# Patient Record
Sex: Male | Born: 1945 | Race: White | Hispanic: No | Marital: Married | Smoking: Never smoker
Health system: Southern US, Community
[De-identification: ages and names within clinical notes are randomized; demographics above are authoritative.]

## PROBLEM LIST (undated history)

## (undated) DIAGNOSIS — R200 Anesthesia of skin: Secondary | ICD-10-CM

## (undated) DIAGNOSIS — C801 Malignant (primary) neoplasm, unspecified: Secondary | ICD-10-CM

## (undated) DIAGNOSIS — C61 Malignant neoplasm of prostate: Secondary | ICD-10-CM

## (undated) DIAGNOSIS — R3129 Other microscopic hematuria: Secondary | ICD-10-CM

## (undated) DIAGNOSIS — J45909 Unspecified asthma, uncomplicated: Secondary | ICD-10-CM

## (undated) DIAGNOSIS — R3912 Poor urinary stream: Secondary | ICD-10-CM

## (undated) DIAGNOSIS — E7800 Pure hypercholesterolemia, unspecified: Secondary | ICD-10-CM

## (undated) DIAGNOSIS — M1990 Unspecified osteoarthritis, unspecified site: Secondary | ICD-10-CM

## (undated) DIAGNOSIS — R3916 Straining to void: Secondary | ICD-10-CM

## (undated) DIAGNOSIS — C439 Malignant melanoma of skin, unspecified: Secondary | ICD-10-CM

## (undated) HISTORY — PX: OTHER SURGICAL HISTORY: SHX169

---

## 2014-06-06 ENCOUNTER — Emergency Department (HOSPITAL_COMMUNITY)
Admission: EM | Admit: 2014-06-06 | Discharge: 2014-06-06 | Disposition: A | Discharge status: Home / Self Care | Payer: Medicare PPO | Attending: Emergency Medicine | Admitting: Emergency Medicine

## 2014-06-06 ENCOUNTER — Emergency Department (HOSPITAL_COMMUNITY): Payer: Medicare PPO

## 2014-06-06 ENCOUNTER — Encounter (HOSPITAL_COMMUNITY): Payer: Self-pay | Admitting: Emergency Medicine

## 2014-06-06 ENCOUNTER — Encounter: Payer: Self-pay | Admitting: Orthopedic Surgery

## 2014-06-06 DIAGNOSIS — S68610A Complete traumatic transphalangeal amputation of right index finger, initial encounter: Secondary | ICD-10-CM | POA: Insufficient documentation

## 2014-06-06 DIAGNOSIS — Y9389 Activity, other specified: Secondary | ICD-10-CM | POA: Diagnosis not present

## 2014-06-06 DIAGNOSIS — Z880 Allergy status to penicillin: Secondary | ICD-10-CM | POA: Diagnosis not present

## 2014-06-06 DIAGNOSIS — W230XXA Caught, crushed, jammed, or pinched between moving objects, initial encounter: Secondary | ICD-10-CM | POA: Insufficient documentation

## 2014-06-06 DIAGNOSIS — Z23 Encounter for immunization: Secondary | ICD-10-CM | POA: Diagnosis not present

## 2014-06-06 DIAGNOSIS — Y9289 Other specified places as the place of occurrence of the external cause: Secondary | ICD-10-CM | POA: Diagnosis not present

## 2014-06-06 DIAGNOSIS — S60940A Unspecified superficial injury of right index finger, initial encounter: Secondary | ICD-10-CM | POA: Diagnosis present

## 2014-06-06 MED ORDER — LIDOCAINE HCL 2 % IJ SOLN
10.0000 mL | Freq: Once | INTRAMUSCULAR | Status: DC
  Filled 2014-06-06: qty 20

## 2014-06-06 MED ORDER — BUPIVACAINE HCL 0.25 % IJ SOLN
20.0000 mL | Freq: Once | INTRAMUSCULAR | Status: DC
  Filled 2014-06-06: qty 20

## 2014-06-06 MED ORDER — BUPIVACAINE HCL (PF) 0.25 % IJ SOLN
30.0000 mL | Freq: Once | INTRAMUSCULAR | Status: DC
  Filled 2014-06-06: qty 30

## 2014-06-06 MED ORDER — LIDOCAINE HCL 2 % IJ SOLN
20.0000 mL | Freq: Once | INTRAMUSCULAR | Status: AC
  Administered 2014-06-06: 400 mg via INTRADERMAL
  Filled 2014-06-06: qty 20

## 2014-06-06 MED ORDER — ONDANSETRON HCL 4 MG/2ML IJ SOLN
4.0000 mg | Freq: Once | INTRAMUSCULAR | Status: AC
  Administered 2014-06-06: 4 mg via INTRAVENOUS
  Filled 2014-06-06: qty 2

## 2014-06-06 MED ORDER — CEFAZOLIN SODIUM 1-5 GM-% IV SOLN
1.0000 g | Freq: Once | INTRAVENOUS | Status: AC
  Administered 2014-06-06: 1 g via INTRAVENOUS
  Filled 2014-06-06: qty 50

## 2014-06-06 MED ORDER — OXYCODONE-ACETAMINOPHEN 5-325 MG PO TABS: 1.0000 | ORAL_TABLET | Freq: Three times a day (TID) | ORAL | Status: DC | PRN

## 2014-06-06 MED ORDER — TETANUS-DIPHTH-ACELL PERTUSSIS 5-2.5-18.5 LF-MCG/0.5 IM SUSP
0.5000 mL | Freq: Once | INTRAMUSCULAR | Status: AC
  Administered 2014-06-06: 0.5 mL via INTRAMUSCULAR
  Filled 2014-06-06: qty 0.5

## 2014-06-06 MED ORDER — SULFAMETHOXAZOLE-TRIMETHOPRIM 800-160 MG PO TABS: 1.0000 | ORAL_TABLET | Freq: Two times a day (BID) | ORAL | Status: AC

## 2014-06-06 MED ORDER — MORPHINE SULFATE 4 MG/ML IJ SOLN
4.0000 mg | Freq: Once | INTRAMUSCULAR | Status: AC
  Administered 2014-06-06: 4 mg via INTRAVENOUS
  Filled 2014-06-06: qty 1

## 2014-06-06 NOTE — ED Notes (Signed)
Pt appears comfortable, talking on phone.

## 2014-06-06 NOTE — ED Notes (Signed)
Pt declines pain meds at this time

## 2014-06-06 NOTE — ED Notes (Addendum)
This morning pt shut right index finger in door. Half on the nailbed amputated. Sensation intact. Denies blood thinner. Pt is a x 4.

## 2014-06-06 NOTE — Progress Notes (Unsigned)
Patient ID: Fred Thornton, male   DOB: 02/21/1946, 68 y.o.   MRN: [id number] See full consult # [id number] [place]

## 2014-06-06 NOTE — ED Notes (Signed)
Pain medication offered. Pt denied need for any at this time.

## 2014-06-06 NOTE — ED Notes (Signed)
Pt moved to stretcher to wait for hand surgeon.

## 2014-06-06 NOTE — ED Notes (Signed)
Finger tip placed in sterile gauze with sterile saline/sterile container and placed on ice.

## 2014-06-06 NOTE — ED Notes (Signed)
Declined W/C at D/C and was escorted to lobby by RN. 

## 2014-06-06 NOTE — Progress Notes (Signed)
Orthopedic Tech Progress Note Patient Details:  Fred Thornton 06-14-1946 [id number]  Ortho Devices Type of Ortho Device: Finger splint Ortho Device/Splint Interventions: Criss Alvine 06/06/2014, 6:16 PM

## 2014-06-06 NOTE — ED Provider Notes (Signed)
CSN: [id number]     Arrival date & time 06/06/14  4315 History  This chart was scribed for non-physician practitioner [name], working with Tanna Furry, MD by Donato Schultz, ED Scribe. This patient was seen in room TR07C/TR07C and the patient's care was started at 10:33 AM.    Chief Complaint  Patient presents with  . Finger Injury    The history is provided by the patient. No language interpreter was used.   HPI Comments: Fred Thornton is a 68 y.o. male who presents to the Emergency Department complaining of an injury to his right index finger that started at 9:35 AM this morning after he shut his finger in his car door.  He states that he is not in any pain but half of the tip on the right index finger is completely amputated.  He is not sure if his Tdap is UTD but states it's likely within the past 5 years.  He is right hand dominant.  No numbness.  Last meal this AM. Not on blood thinner.    History reviewed. No pertinent past medical history. History reviewed. No pertinent past surgical history. No family history on file. History  Substance Use Topics  . Smoking status: Never Smoker   . Smokeless tobacco: Not on file  . Alcohol Use: Yes    Review of Systems  Constitutional: Negative for fever.  Skin: Positive for wound.  Neurological: Negative for numbness.      Allergies  Amoxicillin  Home Medications   Prior to Admission medications   Not on File   BP 158/108  Temp(Src) 97.9 F (36.6 C) (Oral)  Resp 18  Ht 6' 1.75" (1.873 m)  Wt 214 lb (97.07 kg)  BMI 27.67 kg/m2  SpO2 99%  Physical Exam  Nursing note and vitals reviewed. Constitutional: He appears well-developed and well-nourished.  HENT:  Head: Normocephalic and atraumatic.  Eyes: EOM are normal.  Neck: Normal range of motion.  Cardiovascular: Normal rate.   Pulmonary/Chest: Effort normal.  Musculoskeletal: Normal range of motion.  R index finger: complete amputation of the distal phalanx with  complete nail avulsion.  Exposed bone, ttp, no joint involvement.  Neurological: He is alert.  Skin: Skin is warm and dry.  Psychiatric: He has a normal mood and affect. His behavior is normal.    ED Course  Procedures (including critical care time)  DIAGNOSTIC STUDIES: Oxygen Saturation is 99% on room air, normal by my interpretation.    COORDINATION OF CARE:  11:20 AM Complete amputation of R index distal phalanx with nail involvement.  No joint involvement.  Xray demonstrate open fracture.  Will consult hand specialist for further care.    12:15 PM i have consulted hand specialist, Dr. Amedeo Plenty who request starting pt on Ancef and he will see pt in ER.   4:25 PM Dr. Vanetta Shawl PA has seen pt and will attempt to sutures the wound.  Care discussed to oncoming provider, who will d/c pt pending treatment.     Labs Review Labs Reviewed - No data to display  Imaging Review Dg Finger Index Right  06/06/2014   CLINICAL DATA:  Initial evaluation for injury to distal right index finger 1 hr ago, close car door on index finger  EXAM: RIGHT INDEX FINGER 2+V  COMPARISON:  None.  FINDINGS: There is amputation of the soft tissues of the distal tip of the right index finger. There is a comminuted fracture of the distal aspect of the distal phalanx, with moderate  displacement of fracture fragments. The tuft of the distal phalanx appears absent.  IMPRESSION: Comminuted moderately displaced fracture distal phalanx   Electronically Signed   By: Skipper Cliche M.D.   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 I personally reviewed the imaging tests through PACS system  I reviewed  available ER/hospitalization records thought the EMR   I personally performed the services described in this documentation, which was scribed in my presence. The recorded information has been reviewed and is accurate.     [name], PA-C 06/07/14 [phone]

## 2014-06-06 NOTE — ED Notes (Signed)
Hand specialist at bedside

## 2014-06-06 NOTE — ED Provider Notes (Signed)
Transfer of care from [name], PA-C at change in shift.   Fred Thornton is a 68 year old male presenting to emergency department right index finger injury resulting in a partial dictation.  4:50 PM Hand specialist, PA at bedside performing repair.   6:00 PM Hand specialist, Dr. Amedeo Plenty, repaired patient's finger in the ED setting. Finger spplint placed as her Dr. Vanetta Shawl instructions. This provider was instructed by hand specialist PA to discharge patient with Bactrim for 2 weeks and oxycodone. Reported that patient is to followup in the office next week. Patient stable, afebrile. Patient not septic appearing. Patient stable for discharge as per hand specialist.  Medications  ceFAZolin (ANCEF) IVPB 1 g/50 mL premix (0 g Intravenous Stopped 06/06/14 1408)  morphine 4 MG/ML injection 4 mg (4 mg Intravenous Given 06/06/14 1307)  ondansetron (ZOFRAN) injection 4 mg (4 mg Intravenous Given 06/06/14 1307)  lidocaine (XYLOCAINE) 2 % (with pres) injection 400 mg (400 mg Intradermal Given by Other 06/06/14 1627)  Tdap (BOOSTRIX) injection 0.5 mL (0.5 mLs Intramuscular Given 06/06/14 1716)  ceFAZolin (ANCEF) IVPB 1 g/50 mL premix (1 g Intravenous New Bag/Given 06/06/14 1717)   Filed Vitals:   06/06/14 0948 06/06/14 1353  BP: 158/108 120/80  Pulse:  55  Temp: 97.9 F (36.6 C)   TempSrc: Oral   Resp: 18   Height: 6' 1.75" (1.873 m)   Weight: 214 lb (97.07 kg)   SpO2: 99% 97%   Diagnoses that have been ruled out:  None  Diagnoses that are still under consideration:  None  Final diagnoses:  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

## 2014-06-06 NOTE — Discharge Instructions (Signed)
Fingertip Injuries and Amputations °Fingertip injuries are common and often get injured because they are last to escape when pulling your hand out of harm's way. You have amputated (cut off) part of your finger. How this turns out depends largely on how much was amputated. If just the tip is amputated, often the end of the finger will grow back and the finger may return to much the same as it was before the injury.  °If more of the finger is missing, your caregiver has done the best with the tissue remaining to allow you to keep as much finger as is possible. Your caregiver after checking your injury has tried to leave you with a painless fingertip that has durable, feeling skin. If possible, your caregiver has tried to maintain the finger's length and appearance and preserve its fingernail.  °Please read the instructions outlined below and refer to this sheet in the next few weeks. These instructions provide you with general information on caring for yourself. Your caregiver may also give you specific instructions. While your treatment has been done according to the most current medical practices available, unavoidable complications occasionally occur. If you have any problems or questions after discharge, please call your caregiver. °HOME CARE INSTRUCTIONS  °· You may resume normal diet and activities as directed or allowed. °· Keep your hand elevated above the level of your heart. This helps decrease pain and swelling. °· Keep ice packs (or a bag of ice wrapped in a towel) on the injured area for 15-20 minutes, 03-04 times per day, for the first two days. °· Change dressings if necessary or as directed. °· Clean the wound daily or as directed. °· Only take over-the-counter or prescription medicines for pain, discomfort, or fever as directed by your caregiver. °· Keep appointments as directed. °SEEK IMMEDIATE MEDICAL CARE IF: °· You develop redness, swelling, numbness or increasing pain in the wound. °· There is  pus coming from the wound. °· You develop an unexplained oral temperature above 102° F (38.9° C) or as your caregiver suggests. °· There is a foul (bad) smell coming from the wound or dressing. °· There is a breaking open of the wound (edges not staying together) after sutures or staples have been removed. °MAKE SURE YOU:  °· Understand these instructions. °· Will watch your condition. °· Will get help right away if you are not doing well or get worse. °Document Released: 07/07/2005 Document Revised: 11/08/2011 Document Reviewed: 06/05/2008 °ExitCare® Patient Information ©2015 [employer]. This information is not intended to replace advice given to you by your health care provider. Make sure you discuss any questions you have with your health care provider. ° °

## 2014-06-07 NOTE — Consult Note (Signed)
NAME:  Fred Thornton NO.:  [id number]  MEDICAL RECORD NO.:  [id number]  LOCATION:  TR11C                        FACILITY:  [facility]  PHYSICIAN:  Satira Anis. Kniyah Khun, M.D.DATE OF BIRTH:  07/25/1946  DATE OF CONSULTATION:  06/06/2014 DATE OF DISCHARGE:  06/06/2014                                CONSULTATION   HISTORY OF PRESENT ILLNESS:  Fred Thornton is a 68 year old male who sustained an injury to his right index finger today.  He had a Guillotine amputation at the distal end of his index finger after a car door hit it.  He was taken to the emergency room and I was called.  I was asked to see him acutely for hand surgical emergency.  He denies prior injury.  He lives in [place].  He is allergic to AMOXICILLIN. He is here today with his wife.  Medicines are reviewed in his chart at length.  The patient has no advanced medical problems.  He denies liver, lung, heart, or kidney disease.  His regular physician is Dr. Melinda Crutch.  PHYSICAL EXAMINATION:  GENERAL:  He is pleasant male.  Alert and oriented in no acute distress. VITAL SIGNS:  Stable. HEENT:  He has normal HEENT. CHEST:  Clear. ABDOMEN:  Nontender, nondistended. HEART:  Regular rate. EXTREMITIES:  Lower extremity examination is benign with normal anatomy and no evidence of trauma.  Left upper extremity is neurovascularly intact.  Right upper extremity shows a Guillotine amputation about the distal phalanx of his finger with nail bed injury, nail plate injury, and disarray of the soft tissues.  X-RAYS:  X-rays reviewed; AP, lateral, and oblique.  Examined by myself and noted to be significant for fracture at the distal phalanx.  IMPRESSION:  Guillotine amputation, right index finger.  PLAN:  We can send him for I and D repair as necessary.  He desires to proceed.  All questions have been encouraged and answered.  He has been given appropriate antibiotics, he has been counseled.  He understands  risks of bleeding, infection, anesthesia, loss of nail, failure of surgery to accomplish its intended goals of relieving symptoms and restoring function.  With all issues in mind, he desires to proceed.  OPERATIVE PROCEDURE:  The patient was seen by myself and Mr. Jenean Lindau. He was given intermetacarpal block.  He underwent 2 separate surgical Betadine scrub and paints followed by copious irrigation with greater than 2 L of saline.  Following this, sterile field was secured.  This was an I and D excisional nature of an open fracture.  Knife, blade, curette, and scissor was used.  Following this, nail plate remnant was removed.  Following this, we trimmed up the eponychial fold and then performed open treatment of the bony fracture with shortening technique.  Following this, the nail bed was then repaired under 4.0 loupe magnification with 6-0 chromic.  Following this, I then performed a volar advancement flap.  The advancement flap underwent incision followed by advancing the flap for coverage from volar to dorsal.  This was inset with a combination of 4-0 Prolene, 6-0 chromic, and 5-0 chromic, and 4-0 chromic.  The patient tolerated this well.  I sculpted the edges nicely so  there would be no redundant edges.  It looked excellent.  Tourniquet was deflated.  Adaptic placed under the eponychial fold.  Following this, Adaptic, Vaseline gauze, and a sterile dressing was placed.  He tolerated this well.  He had good refill.  No complicating features.  We actually showed he and his wife the finger and they were quite pleased.  He is going to have questionable nail growth given the stellate laceration to the nail bed.  However, I do feel that we have given him ample opportunity to have the best finger with the most length.  Postoperatively, we will go ahead and plan for a conservative algorithm of care, dressing change in 8-12 days, therapy for splint protector.  We will keep the  splint on and protect the finger until the soft tissue parameters look good and then we will go ahead and move him aggressively.  He will take 6 months for the nail to regrow to a cosmetic look that we can deem stabilized into the future.  He was given Bactrim and OxyIR for purposes of antibiotic prophylaxis from infection and pain control.  Elevate, move the adjacent fingers and keep the area clean and dry.  It was a pleasure to see him today.  All questions have been encouraged and answered, do's and don'ts discussed.  Should any problems arise, he will notify us of course.  It was an absolute pleasure to see him today and participate in his care.  Should any problems occur, he will notify us of course.  Once again, he underwent, 1. I and D, open fracture, excisional in nature. 2. Nail plate removal. 3. Nail bed repair. 4. Open treatment of distal phalanx fracture. 5. Volar advancement flap, left index finger, Guillotine amputation.     Satira Anis. Amedeo Plenty, M.D.     [facility]  D:  06/06/2014  T:  06/07/2014  Job:  [id number]

## 2014-06-07 NOTE — ED Provider Notes (Signed)
Medical screening examination/treatment/procedure(s) were performed by non-physician practitioner and as supervising physician I was immediately available for consultation/collaboration.   EKG Interpretation None        Ephraim Hamburger, MD 06/07/14 1644

## 2014-06-14 NOTE — ED Provider Notes (Signed)
Medical screening examination/treatment/procedure(s) were performed by non-physician practitioner and as supervising physician I was immediately available for consultation/collaboration.   EKG Interpretation None        Tanna Furry, MD 06/14/14 [phone]

## 2014-11-15 DIAGNOSIS — C61 Malignant neoplasm of prostate: Secondary | ICD-10-CM

## 2014-11-15 HISTORY — PX: PROSTATE BIOPSY: SHX241

## 2014-11-15 HISTORY — DX: Malignant neoplasm of prostate: C61

## 2014-11-21 ENCOUNTER — Other Ambulatory Visit (HOSPITAL_COMMUNITY): Payer: Self-pay | Admitting: Urology

## 2014-11-21 DIAGNOSIS — C61 Malignant neoplasm of prostate: Secondary | ICD-10-CM

## 2014-12-12 ENCOUNTER — Encounter (HOSPITAL_COMMUNITY)
Admission: RE | Admit: 2014-12-12 | Discharge: 2014-12-12 | Disposition: A | Discharge status: Home / Self Care | Payer: Medicare PPO | Source: Ambulatory Visit | Attending: Urology | Admitting: Urology

## 2014-12-12 DIAGNOSIS — C61 Malignant neoplasm of prostate: Secondary | ICD-10-CM | POA: Diagnosis present

## 2014-12-12 MED ORDER — TECHNETIUM TC 99M MEDRONATE IV KIT
25.7000 | PACK | Freq: Once | INTRAVENOUS | Status: AC | PRN
  Administered 2014-12-12: 25.7 via INTRAVENOUS

## 2015-01-01 ENCOUNTER — Encounter: Payer: Self-pay | Admitting: Radiation Oncology

## 2015-01-01 NOTE — Progress Notes (Signed)
GU Location of Tumor / Histology: Adenocarcinoma of the Prostate  If Prostate Cancer, Gleason Score is ( 3+ 4 in 3 cores), (4+3 in 1 core), (3+3 in 2 cores) and PSA is (7.88)  Fred Thornton was originally referred by Suella Grove, PA  for further evaluation of an elevated PSA of 7.06 on 07/19/14. Urinary frequency, hesistancy, straining to void, weak flow, nad nocturia.   Past/Anticipated interventions by urology, if any: Sigmund Tannenbaum: Biopsy of the Prostate  Past/Anticipated interventions by medical oncology, if any: None at this time  Weight changes, if any: no  Bowel/Bladder complaints, if any: denies bowel issues.  IPSS score of 4.  Takes Flomax.  Nausea/Vomiting, if any: no  Pain issues, if any:  no  SAFETY ISSUES:   Prior radiation? No  Pacemaker/ICD? No  Possible current pregnancy? N/A  Is the patient on methotrexate? 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

## 2015-01-02 ENCOUNTER — Ambulatory Visit
Admission: RE | Admit: 2015-01-02 | Discharge: 2015-01-02 | Disposition: A | Discharge status: Home / Self Care | Payer: Medicare PPO | Source: Ambulatory Visit | Attending: Radiation Oncology | Admitting: Radiation Oncology

## 2015-01-02 ENCOUNTER — Encounter: Payer: Self-pay | Admitting: Radiation Oncology

## 2015-01-02 VITALS — BP 140/90 | HR 70 | Temp 98.2°F | Resp 20 | Ht 74.0 in | Wt 226.2 lb

## 2015-01-02 DIAGNOSIS — C61 Malignant neoplasm of prostate: Secondary | ICD-10-CM

## 2015-01-02 HISTORY — DX: Malignant neoplasm of prostate: C61

## 2015-01-02 HISTORY — DX: Malignant (primary) neoplasm, unspecified: C80.1

## 2015-01-02 HISTORY — DX: Malignant melanoma of skin, unspecified: C43.9

## 2015-01-02 HISTORY — DX: Poor urinary stream: R39.12

## 2015-01-02 HISTORY — DX: Pure hypercholesterolemia, unspecified: E78.00

## 2015-01-02 HISTORY — DX: Other microscopic hematuria: R31.29

## 2015-01-02 HISTORY — DX: Straining to void: R39.16

## 2015-01-02 NOTE — Progress Notes (Signed)
Please see the Nurse Progress Note in the MD Initial Consult Encounter for this patient. 

## 2015-01-02 NOTE — Progress Notes (Signed)
[facility] NEW PATIENT EVALUATION  Name: Fred Thornton MRN: [id number]  Date:   01/02/2015           DOB: [birth year]  Status: outpatient   CC:  Melinda Crutch, MD  Carolan Clines, MD , Dr. 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 male who is seen today through the courtesy of Dr. Gaynelle Arabian for discussion of possible radiation therapy in the management of his Stage T2b adenocarcinoma prostate.  He tells me that his PSA back in 2011 was in the 4-5 range.  His PSA rose to 7.06 by November 2015 along with an I PSS score of 18.  Dr. Era Bumpers repeated his PSA which was 7.88.  He underwent ultrasound-guided biopsies on 11/15/2014.  He was found to have Gleason 7 (4+3) involving 60% of one core from the right lateral apex, (pattern 4= 70%).  He also had Gleason 7 (3+4) involving 15% of one core from the right lateral base, 40% of one core from the right lateral mid gland and less than 5% of one core from the right mid gland.  He Gleason 6 (3+3) involving 30% of one core from the right apex and 5% of one core from the left mid gland.  His gland volume was approximately 77.3 mL.  His staging workup included a bone scan and CT scan of the abdomen/pelvis on 12/12/2014, and these were without evidence for metastatic disease.  He is currently doing well from a GU and GI standpoint, having started tamsulosin.  His I PSS score today is 4.  He is potent.  Dr. Era Bumpers mentioned having him have a second opinion with Dr. Alinda Money for discussion of robotic surgery.  PREVIOUS RADIATION THERAPY: No   PAST MEDICAL HISTORY:  has a past medical history of Prostate cancer (11/15/14); Microhematuria; Urinary straining; Weak urinary stream; Hypercholesteremia; Malignant neoplasm; and Melanoma.     PAST SURGICAL HISTORY:  Past Surgical  History  Procedure Laterality Date  . Prostate biopsy  11/15/14  . Hx of dermatological surgery    . Hx of metacarpal amputation      and Index finger     FAMILY HISTORY: family history includes Breast cancer in his mother; Heart block in his father.  His father died from complications of Alzheimer's disease at age 55.  His mother died from metastatic breast cancer at 10.  No family history of prostate cancer.   SOCIAL HISTORY:  reports that he has never smoked. He has never used smokeless tobacco. He reports that he drinks alcohol. He reports that he does not use illicit drugs.  Married, 2 children.  He works as a Regulatory affairs officer.   ALLERGIES: Amoxicillin   MEDICATIONS:  Current Outpatient Prescriptions  Medication Sig Dispense Refill  . tamsulosin (FLOMAX) 0.4 MG CAPS capsule Take 0.4 mg by mouth.    . oxyCODONE-acetaminophen (PERCOCET/ROXICET) 5-325 MG per tablet Take 1 tablet by mouth every 8 (eight) hours as needed for moderate pain or severe pain. (Patient not taking: Reported on 01/02/2015) 11 tablet 0   No current facility-administered medications for this encounter.     REVIEW OF SYSTEMS:  Pertinent items are noted in HPI.    PHYSICAL EXAM:  height is 6\' 2"  (1.88 m) and weight is 226 lb 3.2 oz (102.604 kg). His oral temperature is 98.2 F (36.8 C). His  blood pressure is 140/90 and his pulse is 70. His respiration is 20.   Rectal examination: The prostate gland is enlarged and there is induration along much of the right gland without evidence for periprostatic tumor extension.   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  I explained to Mr. Eline that his prognosis is related to his stage, Gleason score, and PSA level.  His Gleason score 7 is of intermediate favorability  and this places him in the intermediate risk group.  Management options include robotic surgery versus radiation therapy.  Ration therapy options include 5 weeks of external beam followed by seed implantation or 8 weeks of external beam/IMRT.  He is not a good candidate for active surveillance in view of his young age and intermediate risk disease with Gleason 7 (4+3) disease.  Initially, I was concerned about his obstructive symptoms, but this has improved significantly on tamsulosin.  His gland is probably too large for seed implantation, but if he were highly motivated, we could downsizing with 3 months of androgen deprivation therapy.  From a radiation therapy standpoint, I feel that he would be better suited for 8 weeks of external beam/IMRT.  We also discussed the option of short-term androgen deprivation therapy (6 months) which is currently being looked at in a national trial (RTOG).  We discussed the potential acute and late toxicities of radiation therapy and also androgen deprivation therapy.  I told him that I thought he would do well with either robotic surgery or radiation therapy.  He weights surgical consultation with Dr. Alinda Money.  He can call me if he wants to consider radiation therapy.  He would need placement of 3 gold seed markers for image guidance.   PLAN: As discussed above.   I spent 60 minutes face to face with the patient and more than 50% of that time was spent in counseling and/or coordination of care.

## 2015-01-28 ENCOUNTER — Other Ambulatory Visit: Payer: Self-pay | Admitting: Urology

## 2015-02-05 ENCOUNTER — Other Ambulatory Visit (HOSPITAL_COMMUNITY): Payer: Self-pay | Admitting: Urology

## 2015-02-05 DIAGNOSIS — C61 Malignant neoplasm of prostate: Secondary | ICD-10-CM

## 2015-03-10 ENCOUNTER — Ambulatory Visit (HOSPITAL_COMMUNITY)
Admission: RE | Admit: 2015-03-10 | Discharge: 2015-03-10 | Disposition: A | Discharge status: Home / Self Care | Payer: Medicare PPO | Source: Ambulatory Visit | Attending: Urology | Admitting: Urology

## 2015-03-10 DIAGNOSIS — C61 Malignant neoplasm of prostate: Secondary | ICD-10-CM | POA: Diagnosis present

## 2015-03-10 LAB — POCT I-STAT CREATININE: Creatinine, Ser: 1.1 mg/dL (ref 0.61–1.24)

## 2015-03-10 MED ORDER — GADOBENATE DIMEGLUMINE 529 MG/ML IV SOLN
20.0000 mL | Freq: Once | INTRAVENOUS | Status: AC | PRN
  Administered 2015-03-10: 20 mL via INTRAVENOUS

## 2015-03-18 ENCOUNTER — Encounter (HOSPITAL_COMMUNITY)
Admission: RE | Admit: 2015-03-18 | Discharge: 2015-03-18 | Disposition: A | Discharge status: Home / Self Care | Payer: Medicare PPO | Source: Ambulatory Visit | Attending: Urology | Admitting: Urology

## 2015-03-18 ENCOUNTER — Encounter (HOSPITAL_COMMUNITY): Payer: Self-pay

## 2015-03-18 HISTORY — DX: Anesthesia of skin: R20.0

## 2015-03-18 HISTORY — DX: Unspecified asthma, uncomplicated: J45.909

## 2015-03-18 LAB — BASIC METABOLIC PANEL
Anion gap: 9 (ref 5–15)
BUN: 15 mg/dL (ref 6–20)
CO2: 24 mmol/L (ref 22–32)
Calcium: 9.6 mg/dL (ref 8.9–10.3)
Chloride: 107 mmol/L (ref 101–111)
Creatinine, Ser: 1.14 mg/dL (ref 0.61–1.24)
GFR calc Af Amer: >60 mL/min (ref >60)
GFR calc non Af Amer: >60 mL/min (ref >60)
Glucose, Bld: 142 mg/dL — ABNORMAL HIGH (ref 65–99)
POTASSIUM: 4 mmol/L (ref 3.5–5.1)
Sodium: 140 mmol/L (ref 135–145)

## 2015-03-18 LAB — CBC
HEMATOCRIT: 48.1 % (ref 39.0–52.0)
Hemoglobin: 16.1 g/dL (ref 13.0–17.0)
MCH: 30.8 pg (ref 26.0–34.0)
MCHC: 33.5 g/dL (ref 30.0–36.0)
MCV: 92 fL (ref 78.0–100.0)
Platelets: 169 10*3/uL (ref 150–400)
RBC: 5.23 MIL/uL (ref 4.22–5.81)
RDW: 13 % (ref 11.5–15.5)
WBC: 5.2 10*3/uL (ref 4.0–10.5)

## 2015-03-18 LAB — ABO/RH: ABO/RH(D): O NEG

## 2015-03-18 NOTE — Progress Notes (Signed)
CT abd / pelvis epic 12/20/2014

## 2015-03-18 NOTE — Patient Instructions (Signed)
Fred Thornton  03/18/2015   Your procedure is scheduled on: Thursday March 20, 2015  Report to [facility] Main  Entrance take [place]  elevators to 3rd floor to  [place] at 5:15 AM.  Call this number if you have problems the morning of surgery [phone]   Remember: ONLY 1 PERSON MAY GO WITH YOU TO SHORT STAY TO GET  READY MORNING OF [place].  Do not eat food or drink liquids :After Midnight.     Take these medicines the morning of surgery with A SIP OF WATER: NONE                               You may not have any metal on your body including hair pins and              piercings  Do not wear jewelry, lotions, powders or colognes, deodorant                           Men may shave face and neck.   Do not bring valuables to the hospital. [place].  Contacts, dentures or bridgework may not be worn into surgery.  Leave suitcase in the car. After surgery it may be brought to your room.                  Please read over the following fact sheets you were given:INCENTIVE SPIROMETER; BLOOD TRANSFUSION INFORMATION SHEET _____________________________________________________________________             [facility] - Preparing for Surgery Before surgery, you can play an important role.  Because skin is not sterile, your skin needs to be as free of germs as possible.  You can reduce the number of germs on your skin by washing with CHG (chlorahexidine gluconate) soap before surgery.  CHG is an antiseptic cleaner which kills germs and bonds with the skin to continue killing germs even after washing. Please DO NOT use if you have an allergy to CHG or antibacterial soaps.  If your skin becomes reddened/irritated stop using the CHG and inform your nurse when you arrive at Short Stay. Do not shave (including legs and underarms) for at least 48 hours prior to the first CHG shower.  You may shave your  face/neck. Please follow these instructions carefully:  1.  Shower with CHG Soap the night before surgery and the  morning of Surgery.  2.  If you choose to wash your hair, wash your hair first as usual with your  normal  shampoo.  3.  After you shampoo, rinse your hair and body thoroughly to remove the  shampoo.                           4.  Use CHG as you would any other liquid soap.  You can apply chg directly  to the skin and wash                       Gently with a scrungie or clean washcloth.  5.  Apply the CHG Soap to your body ONLY FROM THE NECK DOWN.   Do  not use on face/ open                           Wound or open sores. Avoid contact with eyes, ears mouth and genitals (private parts).                       Wash face,  Genitals (private parts) with your normal soap.             6.  Wash thoroughly, paying special attention to the area where your surgery  will be performed.  7.  Thoroughly rinse your body with warm water from the neck down.  8.  DO NOT shower/wash with your normal soap after using and rinsing off  the CHG Soap.                9.  Pat yourself dry with a clean towel.            10.  Wear clean pajamas.            11.  Place clean sheets on your bed the night of your first shower and do not  sleep with pets. Day of Surgery : Do not apply any lotions/deodorants the morning of surgery.  Please wear clean clothes to the hospital/surgery center.  FAILURE TO FOLLOW THESE INSTRUCTIONS MAY RESULT IN THE CANCELLATION OF YOUR SURGERY PATIENT SIGNATURE_________________________________  NURSE SIGNATURE__________________________________  ________________________________________________________________________   Fred Thornton  An incentive spirometer is a tool that can help keep your lungs clear and active. This tool measures how well you are filling your lungs with each breath. Taking long deep breaths may help reverse or decrease the chance of developing breathing  (pulmonary) problems (especially infection) following:  A long period of time when you are unable to move or be active. BEFORE THE PROCEDURE   If the spirometer includes an indicator to show your best effort, your nurse or respiratory therapist will set it to a desired goal.  If possible, sit up straight or lean slightly forward. Try not to slouch.  Hold the incentive spirometer in an upright position. INSTRUCTIONS FOR USE   Sit on the edge of your bed if possible, or sit up as far as you can in bed or on a chair.  Hold the incentive spirometer in an upright position.  Breathe out normally.  Place the mouthpiece in your mouth and seal your lips tightly around it.  Breathe in slowly and as deeply as possible, raising the piston or the ball toward the top of the column.  Hold your breath for 3-5 seconds or for as long as possible. Allow the piston or ball to fall to the bottom of the column.  Remove the mouthpiece from your mouth and breathe out normally.  Rest for a few seconds and repeat Steps 1 through 7 at least 10 times every 1-2 hours when you are awake. Take your time and take a few normal breaths between deep breaths.  The spirometer may include an indicator to show your best effort. Use the indicator as a goal to work toward during each repetition.  After each set of 10 deep breaths, practice coughing to be sure your lungs are clear. If you have an incision (the cut made at the time of surgery), support your incision when coughing by placing a pillow or rolled up towels firmly against it. Once you are able to get out of bed,  walk around indoors and cough well. You may stop using the incentive spirometer when instructed by your caregiver.  RISKS AND COMPLICATIONS  Take your time so you do not get dizzy or light-headed.  If you are in pain, you may need to take or ask for pain medication before doing incentive spirometry. It is harder to take a deep breath if you are having  pain. AFTER USE  Rest and breathe slowly and easily.  It can be helpful to keep track of a log of your progress. Your caregiver can provide you with a simple table to help with this. If you are using the spirometer at home, follow these instructions: [place] IF:   You are having difficultly using the spirometer.  You have trouble using the spirometer as often as instructed.  Your pain medication is not giving enough relief while using the spirometer.  You develop fever of 100.5 F (38.1 C) or higher. SEEK IMMEDIATE MEDICAL CARE IF:   You cough up bloody sputum that had not been present before.  You develop fever of 102 F (38.9 C) or greater.  You develop worsening pain at or near the incision site. MAKE SURE YOU:   Understand these instructions.  Will watch your condition.  Will get help right away if you are not doing well or get worse. Document Released: 12/27/2006 Document Revised: 11/08/2011 Document Reviewed: 02/27/2007 ExitCare Patient Information 2014 Exit[employer], [place].   ________________________________________________________________________  WHAT IS A BLOOD TRANSFUSION? Blood Transfusion Information  A transfusion is the replacement of blood or some of its parts. Blood is made up of multiple cells which provide different functions.  Red blood cells carry oxygen and are used for blood loss replacement.  White blood cells fight against infection.  Platelets control bleeding.  Plasma helps clot blood.  Other blood products are available for specialized needs, such as hemophilia or other clotting disorders. BEFORE THE TRANSFUSION  Who gives blood for transfusions?   Healthy volunteers who are fully evaluated to make sure their blood is safe. This is blood bank blood. Transfusion therapy is the safest it has ever been in the practice of medicine. Before blood is taken from a donor, a complete history is taken to make sure that person has no history  of diseases nor engages in risky social behavior (examples are intravenous drug use or sexual activity with multiple partners). The donor's travel history is screened to minimize risk of transmitting infections, such as malaria. The donated blood is tested for signs of infectious diseases, such as HIV and hepatitis. The blood is then tested to be sure it is compatible with you in order to minimize the chance of a transfusion reaction. If you or a relative donates blood, this is often done in anticipation of surgery and is not appropriate for emergency situations. It takes many days to process the donated blood. RISKS AND COMPLICATIONS Although transfusion therapy is very safe and saves many lives, the main dangers of transfusion include:   Getting an infectious disease.  Developing a transfusion reaction. This is an allergic reaction to something in the blood you were given. Every precaution is taken to prevent this. The decision to have a blood transfusion has been considered carefully by your caregiver before blood is given. Blood is not given unless the benefits outweigh the risks. AFTER THE TRANSFUSION  Right after receiving a blood transfusion, you will usually feel much better and more energetic. This is especially true if your red blood cells  have gotten low (anemic). The transfusion raises the level of the red blood cells which carry oxygen, and this usually causes an energy increase.  The nurse administering the transfusion will monitor you carefully for complications. HOME CARE INSTRUCTIONS  No special instructions are needed after a transfusion. You may find your energy is better. Speak with your caregiver about any limitations on activity for underlying diseases you may have. SEEK MEDICAL CARE IF:   Your condition is not improving after your transfusion.  You develop redness or irritation at the intravenous (IV) site. SEEK IMMEDIATE MEDICAL CARE IF:  Any of the following symptoms  occur over the next 12 hours:  Shaking chills.  You have a temperature by mouth above 102 F (38.9 C), not controlled by medicine.  Chest, back, or muscle pain.  People around you feel you are not acting correctly or are confused.  Shortness of breath or difficulty breathing.  Dizziness and fainting.  You get a rash or develop hives.  You have a decrease in urine output.  Your urine turns a dark color or changes to pink, red, or brown. Any of the following symptoms occur over the next 10 days:  You have a temperature by mouth above 102 F (38.9 C), not controlled by medicine.  Shortness of breath.  Weakness after normal activity.  The white part of the eye turns yellow (jaundice).  You have a decrease in the amount of urine or are urinating less often.  Your urine turns a dark color or changes to pink, red, or brown. Document Released: 08/13/2000 Document Revised: 11/08/2011 Document Reviewed: 04/01/2008 [facility] Patient Information 2014 [place], [place].  _______________________________________________________________________

## 2015-03-19 NOTE — H&P (Signed)
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 Gaynelle Arabian. This confirmed Gleason 4+3=7 adenocarcinoma of the prostate with 6 out of 12 biopsy cores positive for malignancy. Staging studies were performed including a bone scan (12/12/14) and CT scan of the abdomen and pelvis (12/12/14) that were negative for metastatic disease. He has no family history of prostate cancer. He initially presented with significant LUTS with a baseline IPSS of 18 with significant improvement of his symptoms after starting alpha blocker therapy with tamsulosin. He has been counseled by Dr. Gaynelle Arabian and Dr. Valere Dross in Radiation Oncology.  TNM stage: cT2b N0 M0 (R base/mid induration) PSA: 7.88 Gleason score: 4+3=7 Biopsy (11/15/14): 6/12 cores positive   Left: L mid (5%, 3+3=6)   Right: R apex (30%, 3+3=6), R lateral apex (60%, 4+3=7), R mid (<5%, 3+4=7, PNI), R lateral mid (40%, 3+4=7, PNI), R lateral base (15%, 3+4=7, PNI) Prostate volume: 77.3 cc  Nomogram information OC disease: 27% EPE: 70% SVI: 12% LNI: 12% PFS (surgery): 60%, 44%  Urinary function: He does have significant lower urinary tract symptoms that have been significantly improved with alpha blocker therapy. He feels that tamsulosin, which he started in January, has significantly improved his quality of life with regard to urination. IPSS is 7 on therapy.  Erectile function: He does have moderate erectile dysfunction. SHIM score is 17. He has not previously been treated with PDE 5 inhibitors. He estimates that he could get an erection adequate for intercourse 7 out of 10 times.     Past Medical History Problems  1. History of hypercholesterolemia (Z86.39) 2. History of malignant neoplasm of skin (Y30.160)  Surgical History Problems  1.  History of Dermatological Surgery 2. History of Metacarpal Amputation And Index Finger  Current Meds 1. Tamsulosin HCl - 0.4 MG Oral Capsule; TAKE 1 CAPSULE Bedtime;  Therapy: [id number] to (Last DU:[id number])  Requested for: [id number] Ordered  Allergies Medication  1. Amoxicillin TABS  Family History Problems  1. Family history of Deceased : Mother, Father 2. Family history of cardiac disorder (Z82.49) : Father 3. Family history of malignant neoplasm of breast (Z80.3) : Mother  Social History Problems    Alcohol use (Z78.9)   < 1 per day   Married   Never a smoker   Number of children   2 sons   Occupation   Forensic psychologist  Review of Systems Constitutional, skin, eye, otolaryngeal, hematologic/lymphatic, cardiovascular, pulmonary, endocrine, musculoskeletal, gastrointestinal, neurological and psychiatric system(s) were reviewed and pertinent findings if present are noted and are otherwise negative.    Vitals  Height: 6 ft 1.5 in Weight: 214 lb  BMI Calculated: 27.85 BSA Calculated: 2.23  Physical Exam Constitutional: Well nourished and well developed . No acute distress.  ENT:. The ears and nose are normal in appearance.  Neck: The appearance of the neck is normal and no neck mass is present.  Pulmonary: No respiratory distress, normal respiratory rhythm and effort and clear bilateral breath sounds.  Cardiovascular: Heart rate and rhythm are normal . No peripheral edema.  Abdomen: The abdomen is soft and nontender. No masses are palpated. No CVA tenderness. No hernias are palpable. No hepatosplenomegaly noted.      Assessment Assessed  1. Prostate cancer (C61)    Discussion/Summary 1. Prostate cancer: He  will undergo a robot-assisted laparoscopic radical prostatectomy and bilateral pelvic lymphadenectomy.

## 2015-03-19 NOTE — Anesthesia Preprocedure Evaluation (Addendum)
Anesthesia Evaluation  Patient identified by MRN, date of birth, ID band Patient awake    Reviewed: Allergy & Precautions, NPO status , Patient's Chart, lab work & pertinent test results  History of Anesthesia Complications Negative for: history of anesthetic complications  Airway Mallampati: II  TM Distance: >3 FB Neck ROM: Full    Dental no notable dental hx. (+) Dental Advisory Given   Pulmonary asthma ,  breath sounds clear to auscultation  Pulmonary exam normal       Cardiovascular negative cardio ROS Normal cardiovascular examRhythm:Regular Rate:Normal     Neuro/Psych negative neurological ROS  negative psych ROS   GI/Hepatic negative GI ROS, Neg liver ROS,   Endo/Other  negative endocrine ROS  Renal/GU negative Renal ROS  negative genitourinary   Musculoskeletal negative musculoskeletal ROS (+)   Abdominal   Peds negative pediatric ROS (+)  Hematology negative hematology ROS (+)   Anesthesia Other Findings Prostate Ca  Reproductive/Obstetrics negative OB ROS                            Anesthesia Physical Anesthesia Plan  ASA: II  Anesthesia Plan: General   Post-op Pain Management:    Induction: Intravenous  Airway Management Planned: Oral ETT  Additional Equipment:   Intra-op Plan:   Post-operative Plan: Extubation in OR  Informed Consent: I have reviewed the patients History and Physical, chart, labs and discussed the procedure including the risks, benefits and alternatives for the proposed anesthesia with the patient or authorized representative who has indicated his/her understanding and acceptance.   Dental advisory given  Plan Discussed with: CRNA  Anesthesia Plan Comments:         Anesthesia Quick Evaluation

## 2015-03-20 ENCOUNTER — Encounter (HOSPITAL_COMMUNITY)
Admission: RE | Disposition: A | Discharge status: Home / Self Care | Payer: Self-pay | Source: Ambulatory Visit | Attending: Urology

## 2015-03-20 ENCOUNTER — Inpatient Hospital Stay (HOSPITAL_COMMUNITY): Payer: Medicare PPO | Admitting: Anesthesiology

## 2015-03-20 ENCOUNTER — Encounter (HOSPITAL_COMMUNITY): Payer: Self-pay | Admitting: *Deleted

## 2015-03-20 ENCOUNTER — Inpatient Hospital Stay (HOSPITAL_COMMUNITY)
Admission: RE | Admit: 2015-03-20 | Discharge: 2015-03-21 | DRG: 708 | Disposition: A | Discharge status: Home / Self Care | Payer: Medicare PPO | Source: Ambulatory Visit | Attending: Urology | Admitting: Urology

## 2015-03-20 DIAGNOSIS — N529 Male erectile dysfunction, unspecified: Secondary | ICD-10-CM | POA: Diagnosis present

## 2015-03-20 DIAGNOSIS — Z803 Family history of malignant neoplasm of breast: Secondary | ICD-10-CM | POA: Diagnosis not present

## 2015-03-20 DIAGNOSIS — C61 Malignant neoplasm of prostate: Secondary | ICD-10-CM | POA: Diagnosis present

## 2015-03-20 DIAGNOSIS — Z85828 Personal history of other malignant neoplasm of skin: Secondary | ICD-10-CM

## 2015-03-20 DIAGNOSIS — Z01812 Encounter for preprocedural laboratory examination: Secondary | ICD-10-CM

## 2015-03-20 DIAGNOSIS — Z8249 Family history of ischemic heart disease and other diseases of the circulatory system: Secondary | ICD-10-CM

## 2015-03-20 DIAGNOSIS — E780 Pure hypercholesterolemia: Secondary | ICD-10-CM | POA: Diagnosis present

## 2015-03-20 HISTORY — PX: ROBOT ASSISTED LAPAROSCOPIC RADICAL PROSTATECTOMY: SHX5141

## 2015-03-20 HISTORY — PX: LYMPHADENECTOMY: SHX5960

## 2015-03-20 LAB — TYPE AND SCREEN
ABO/RH(D): O NEG
ANTIBODY SCREEN: NEGATIVE

## 2015-03-20 LAB — HEMOGLOBIN AND HEMATOCRIT, BLOOD
HEMATOCRIT: 44.3 % (ref 39.0–52.0)
Hemoglobin: 14.8 g/dL (ref 13.0–17.0)

## 2015-03-20 SURGERY — ROBOTIC ASSISTED LAPAROSCOPIC RADICAL PROSTATECTOMY LEVEL 2
Anesthesia: General

## 2015-03-20 MED ORDER — CEFAZOLIN SODIUM-DEXTROSE 2-3 GM-% IV SOLR
2.0000 g | INTRAVENOUS | Status: AC
  Administered 2015-03-20: 2 g via INTRAVENOUS

## 2015-03-20 MED ORDER — HYDROMORPHONE HCL 2 MG/ML IJ SOLN
INTRAMUSCULAR | Status: AC
  Filled 2015-03-20: qty 1

## 2015-03-20 MED ORDER — ONDANSETRON HCL 4 MG/2ML IJ SOLN
INTRAMUSCULAR | Status: AC
  Filled 2015-03-20: qty 2

## 2015-03-20 MED ORDER — KETOROLAC TROMETHAMINE 15 MG/ML IJ SOLN
15.0000 mg | Freq: Four times a day (QID) | INTRAMUSCULAR | Status: DC
  Administered 2015-03-20 – 2015-03-21 (×4): 15 mg via INTRAVENOUS
  Filled 2015-03-20 (×5): qty 1

## 2015-03-20 MED ORDER — DIPHENHYDRAMINE HCL 12.5 MG/5ML PO ELIX
12.5000 mg | ORAL_SOLUTION | Freq: Four times a day (QID) | ORAL | Status: DC | PRN
  Filled 2015-03-20: qty 10

## 2015-03-20 MED ORDER — STERILE WATER FOR IRRIGATION IR SOLN
Status: DC | PRN
  Administered 2015-03-20: 1500 mL

## 2015-03-20 MED ORDER — HYDROMORPHONE HCL 1 MG/ML IJ SOLN
INTRAMUSCULAR | Status: DC | PRN
  Administered 2015-03-20 (×4): .4 mg via INTRAVENOUS

## 2015-03-20 MED ORDER — ACETAMINOPHEN 325 MG PO TABS
650.0000 mg | ORAL_TABLET | ORAL | Status: DC | PRN
  Filled 2015-03-20: qty 2

## 2015-03-20 MED ORDER — ONDANSETRON HCL 4 MG/2ML IJ SOLN: 4.0000 mg | Freq: Once | INTRAMUSCULAR | Status: DC | PRN

## 2015-03-20 MED ORDER — CEFAZOLIN SODIUM 1-5 GM-% IV SOLN
1.0000 g | Freq: Three times a day (TID) | INTRAVENOUS | Status: AC
  Administered 2015-03-20 – 2015-03-21 (×2): 1 g via INTRAVENOUS
  Filled 2015-03-20 (×3): qty 50

## 2015-03-20 MED ORDER — SODIUM CHLORIDE 0.9 % IJ SOLN
INTRAMUSCULAR | Status: AC
  Filled 2015-03-20: qty 10

## 2015-03-20 MED ORDER — HYDROCODONE-ACETAMINOPHEN 5-325 MG PO TABS: 1.0000 | ORAL_TABLET | Freq: Four times a day (QID) | ORAL | Status: DC | PRN

## 2015-03-20 MED ORDER — KETOROLAC TROMETHAMINE 15 MG/ML IJ SOLN
INTRAMUSCULAR | Status: AC
  Filled 2015-03-20: qty 1

## 2015-03-20 MED ORDER — CISATRACURIUM BESYLATE 20 MG/10ML IV SOLN
INTRAVENOUS | Status: AC
  Filled 2015-03-20: qty 10

## 2015-03-20 MED ORDER — DEXAMETHASONE SODIUM PHOSPHATE 10 MG/ML IJ SOLN
INTRAMUSCULAR | Status: AC
  Filled 2015-03-20: qty 1

## 2015-03-20 MED ORDER — MORPHINE SULFATE 10 MG/ML IJ SOLN: 2.0000 mg | INTRAMUSCULAR | Status: DC | PRN

## 2015-03-20 MED ORDER — CISATRACURIUM BESYLATE (PF) 10 MG/5ML IV SOLN
INTRAVENOUS | Status: DC | PRN
  Administered 2015-03-20: 4 mg via INTRAVENOUS
  Administered 2015-03-20: 2 mg via INTRAVENOUS
  Administered 2015-03-20: 4 mg via INTRAVENOUS
  Administered 2015-03-20: 6 mg via INTRAVENOUS
  Administered 2015-03-20: 14 mg via INTRAVENOUS

## 2015-03-20 MED ORDER — SODIUM CHLORIDE 0.9 % IV BOLUS (SEPSIS)
1000.0000 mL | Freq: Once | INTRAVENOUS | Status: AC
  Administered 2015-03-20: 1000 mL via INTRAVENOUS

## 2015-03-20 MED ORDER — DOCUSATE SODIUM 100 MG PO CAPS
100.0000 mg | ORAL_CAPSULE | Freq: Two times a day (BID) | ORAL | Status: DC
  Administered 2015-03-20: 100 mg via ORAL
  Filled 2015-03-20 (×5): qty 1

## 2015-03-20 MED ORDER — NEOSTIGMINE METHYLSULFATE 10 MG/10ML IV SOLN
INTRAVENOUS | Status: DC | PRN
  Administered 2015-03-20: 4 mg via INTRAVENOUS

## 2015-03-20 MED ORDER — CEFAZOLIN SODIUM-DEXTROSE 2-3 GM-% IV SOLR
INTRAVENOUS | Status: AC
  Filled 2015-03-20: qty 50

## 2015-03-20 MED ORDER — DIPHENHYDRAMINE HCL 50 MG/ML IJ SOLN: 12.5000 mg | Freq: Four times a day (QID) | INTRAMUSCULAR | Status: DC | PRN

## 2015-03-20 MED ORDER — SULFAMETHOXAZOLE-TRIMETHOPRIM 800-160 MG PO TABS: 1.0000 | ORAL_TABLET | Freq: Two times a day (BID) | ORAL | Status: DC

## 2015-03-20 MED ORDER — BUPIVACAINE-EPINEPHRINE 0.25% -1:200000 IJ SOLN
INTRAMUSCULAR | Status: DC | PRN
  Administered 2015-03-20: 30 mL

## 2015-03-20 MED ORDER — SODIUM CHLORIDE 0.9 % IR SOLN
Status: DC | PRN
  Administered 2015-03-20: 1000 mL

## 2015-03-20 MED ORDER — GLYCOPYRROLATE 0.2 MG/ML IJ SOLN
INTRAMUSCULAR | Status: DC | PRN
  Administered 2015-03-20: 0.6 mg via INTRAVENOUS

## 2015-03-20 MED ORDER — LACTATED RINGERS IV SOLN: INTRAVENOUS | Status: DC

## 2015-03-20 MED ORDER — SUFENTANIL CITRATE 50 MCG/ML IV SOLN
INTRAVENOUS | Status: DC | PRN
  Administered 2015-03-20 (×2): 10 ug via INTRAVENOUS
  Administered 2015-03-20: 20 ug via INTRAVENOUS
  Administered 2015-03-20: 10 ug via INTRAVENOUS

## 2015-03-20 MED ORDER — SUCCINYLCHOLINE CHLORIDE 20 MG/ML IJ SOLN
INTRAMUSCULAR | Status: DC | PRN
  Administered 2015-03-20: 100 mg via INTRAVENOUS

## 2015-03-20 MED ORDER — LACTATED RINGERS IV SOLN
INTRAVENOUS | Status: DC | PRN
Start: 2015-03-20 — End: 2015-03-20
  Administered 2015-03-20 (×3): via INTRAVENOUS

## 2015-03-20 MED ORDER — MIDAZOLAM HCL 5 MG/5ML IJ SOLN
INTRAMUSCULAR | Status: DC | PRN
  Administered 2015-03-20: 2 mg via INTRAVENOUS

## 2015-03-20 MED ORDER — LACTATED RINGERS IV SOLN
INTRAVENOUS | Status: DC | PRN
  Administered 2015-03-20: 1 mL

## 2015-03-20 MED ORDER — EPHEDRINE SULFATE 50 MG/ML IJ SOLN
INTRAMUSCULAR | Status: AC
  Filled 2015-03-20: qty 1

## 2015-03-20 MED ORDER — DEXAMETHASONE SODIUM PHOSPHATE 10 MG/ML IJ SOLN
INTRAMUSCULAR | Status: DC | PRN
  Administered 2015-03-20: 10 mg via INTRAVENOUS

## 2015-03-20 MED ORDER — MIDAZOLAM HCL 2 MG/2ML IJ SOLN
INTRAMUSCULAR | Status: AC
  Filled 2015-03-20: qty 2

## 2015-03-20 MED ORDER — KCL IN DEXTROSE-NACL 20-5-0.45 MEQ/L-%-% IV SOLN
INTRAVENOUS | Status: DC
Start: 2015-03-20 — End: 2015-03-21
  Administered 2015-03-20 – 2015-03-21 (×3): via INTRAVENOUS
  Filled 2015-03-20 (×4): qty 1000

## 2015-03-20 MED ORDER — FENTANYL CITRATE (PF) 100 MCG/2ML IJ SOLN
INTRAMUSCULAR | Status: AC
  Filled 2015-03-20: qty 2

## 2015-03-20 MED ORDER — PROPOFOL 10 MG/ML IV BOLUS
INTRAVENOUS | Status: DC | PRN
  Administered 2015-03-20: 200 mg via INTRAVENOUS

## 2015-03-20 MED ORDER — LIDOCAINE HCL (CARDIAC) 20 MG/ML IV SOLN
INTRAVENOUS | Status: AC
  Filled 2015-03-20: qty 5

## 2015-03-20 MED ORDER — ONDANSETRON HCL 4 MG/2ML IJ SOLN
INTRAMUSCULAR | Status: DC | PRN
  Administered 2015-03-20: 4 mg via INTRAVENOUS

## 2015-03-20 MED ORDER — EPHEDRINE SULFATE 50 MG/ML IJ SOLN
INTRAMUSCULAR | Status: DC | PRN
  Administered 2015-03-20 (×4): 5 mg via INTRAVENOUS

## 2015-03-20 MED ORDER — SUFENTANIL CITRATE 50 MCG/ML IV SOLN
INTRAVENOUS | Status: AC
  Filled 2015-03-20: qty 1

## 2015-03-20 MED ORDER — FENTANYL CITRATE (PF) 100 MCG/2ML IJ SOLN
25.0000 ug | INTRAMUSCULAR | Status: DC | PRN
  Administered 2015-03-20: 50 ug via INTRAVENOUS

## 2015-03-20 MED ORDER — HEPARIN SODIUM (PORCINE) 1000 UNIT/ML IJ SOLN
INTRAMUSCULAR | Status: AC
  Filled 2015-03-20: qty 1

## 2015-03-20 MED ORDER — LIDOCAINE HCL (CARDIAC) 20 MG/ML IV SOLN
INTRAVENOUS | Status: DC | PRN
  Administered 2015-03-20: 100 mg via INTRAVENOUS

## 2015-03-20 MED ORDER — PROPOFOL 10 MG/ML IV BOLUS
INTRAVENOUS | Status: AC
  Filled 2015-03-20: qty 20

## 2015-03-20 SURGICAL SUPPLY — 51 items
CABLE HIGH FREQUENCY MONO STRZ (ELECTRODE) ×4 IMPLANT
CATH FOLEY 2WAY SLVR 18FR 30CC (CATHETERS) ×4 IMPLANT
CATH ROBINSON RED A/P 16FR (CATHETERS) ×4 IMPLANT
CATH ROBINSON RED A/P 8FR (CATHETERS) ×4 IMPLANT
CATH TIEMANN FOLEY 18FR 5CC (CATHETERS) ×4 IMPLANT
CHLORAPREP W/TINT 26ML (MISCELLANEOUS) ×4 IMPLANT
CLIP LIGATING HEM O LOK PURPLE (MISCELLANEOUS) ×20 IMPLANT
CLOTH BEACON ORANGE TIMEOUT ST (SAFETY) ×4 IMPLANT
COVER SURGICAL LIGHT HANDLE (MISCELLANEOUS) ×4 IMPLANT
COVER TIP SHEARS 8 DVNC (MISCELLANEOUS) ×2 IMPLANT
COVER TIP SHEARS 8MM DA VINCI (MISCELLANEOUS) ×2
CUTTER ECHEON FLEX ENDO 45 340 (ENDOMECHANICALS) ×4 IMPLANT
DECANTER SPIKE VIAL GLASS SM (MISCELLANEOUS) ×4 IMPLANT
DRAPE SURG IRRIG POUCH 19X23 (DRAPES) ×4 IMPLANT
DRSG TEGADERM 4X4.75 (GAUZE/BANDAGES/DRESSINGS) ×4 IMPLANT
DRSG TEGADERM 6X8 (GAUZE/BANDAGES/DRESSINGS) ×8 IMPLANT
ELECT REM PT RETURN 9FT ADLT (ELECTROSURGICAL) ×4
ELECTRODE REM PT RTRN 9FT ADLT (ELECTROSURGICAL) ×2 IMPLANT
GAUZE SPONGE 4X4 12PLY STRL (GAUZE/BANDAGES/DRESSINGS) ×4 IMPLANT
GLOVE BIO SURGEON STRL SZ 6.5 (GLOVE) ×3 IMPLANT
GLOVE BIO SURGEONS STRL SZ 6.5 (GLOVE) ×1
GLOVE BIOGEL M STRL SZ7.5 (GLOVE) ×8 IMPLANT
GOWN STRL REUS W/TWL LRG LVL3 (GOWN DISPOSABLE) ×12 IMPLANT
HOLDER FOLEY CATH W/STRAP (MISCELLANEOUS) ×4 IMPLANT
IV LACTATED RINGERS 1000ML (IV SOLUTION) ×4 IMPLANT
KIT ACCESSORY DA VINCI DISP (KITS) ×2
KIT ACCESSORY DVNC DISP (KITS) ×2 IMPLANT
LIQUID BAND (GAUZE/BANDAGES/DRESSINGS) ×4 IMPLANT
MANIFOLD NEPTUNE II (INSTRUMENTS) ×4 IMPLANT
NDL SAFETY ECLIPSE 18X1.5 (NEEDLE) ×2 IMPLANT
NEEDLE HYPO 18GX1.5 SHARP (NEEDLE) ×2
PACK ROBOT UROLOGY CUSTOM (CUSTOM PROCEDURE TRAY) ×4 IMPLANT
RELOAD GREEN ECHELON 45 (STAPLE) ×4 IMPLANT
SET TUBE IRRIG SUCTION NO TIP (IRRIGATION / IRRIGATOR) ×4 IMPLANT
SHEET LAVH (DRAPES) IMPLANT
SOLUTION ELECTROLUBE (MISCELLANEOUS) ×4 IMPLANT
SUT ETHILON 3 0 PS 1 (SUTURE) ×4 IMPLANT
SUT MNCRL 3 0 RB1 (SUTURE) ×2 IMPLANT
SUT MNCRL 3 0 VIOLET RB1 (SUTURE) ×2 IMPLANT
SUT MNCRL AB 4-0 PS2 18 (SUTURE) ×8 IMPLANT
SUT MONOCRYL 3 0 RB1 (SUTURE) ×4
SUT VIC AB 0 CT1 27 (SUTURE) ×4
SUT VIC AB 0 CT1 27XBRD ANTBC (SUTURE) ×4 IMPLANT
SUT VIC AB 0 UR5 27 (SUTURE) ×4 IMPLANT
SUT VIC AB 2-0 SH 27 (SUTURE) ×2
SUT VIC AB 2-0 SH 27X BRD (SUTURE) ×2 IMPLANT
SUT VICRYL 0 UR6 27IN ABS (SUTURE) ×12 IMPLANT
SYR 27GX1/2 1ML LL SAFETY (SYRINGE) ×4 IMPLANT
TOWEL OR 17X26 10 PK STRL BLUE (TOWEL DISPOSABLE) ×4 IMPLANT
TOWEL OR NON WOVEN STRL DISP B (DISPOSABLE) ×4 IMPLANT
WATER STERILE IRR 1500ML POUR (IV SOLUTION) ×8 IMPLANT

## 2015-03-20 NOTE — Discharge Instructions (Signed)

## 2015-03-20 NOTE — Op Note (Signed)
Preoperative diagnosis: Clinically localized adenocarcinoma of the prostate (clinical stage T2b N0 M0)  Postoperative diagnosis: Clinically localized adenocarcinoma of the prostate (clinical stage T2b N0 M0)  Procedure:  1. Robotic assisted laparoscopic radical prostatectomy (bilateral nerve sparing) 2. Bilateral robotic assisted laparoscopic pelvic lymphadenectomy  Surgeon: Pryor Curia. M.D.  Assistant: Debbrah Alar, PA-C  Resident: Dr. Verdis Frederickson  Anesthesia: General  Complications: None  EBL: 300 mL  IVF:  2000 mL crystalloid  Specimens: 1. Prostate and seminal vesicles 2. Right pelvic lymph nodes 3. Left pelvic lymph nodes  Disposition of specimens: Pathology  Drains: 1. 20 Fr coude catheter 2. # 19 Blake pelvic drain  Indication: [name] is a 69 y.o. year old patient with clinically localized prostate cancer.  After a thorough review of the management options for treatment of prostate cancer, he elected to proceed with surgical therapy and the above procedure(s).  We have discussed the potential benefits and risks of the procedure, side effects of the proposed treatment, the likelihood of the patient achieving the goals of the procedure, and any potential problems that might occur during the procedure or recuperation. Informed consent has been obtained.  Description of procedure:  The patient was taken to the operating room and a general anesthetic was administered. He was given preoperative antibiotics, placed in the dorsal lithotomy position, and prepped and draped in the usual sterile fashion. Next a preoperative timeout was performed. A urethral catheter was placed into the bladder and a site was selected near the umbilicus for placement of the camera port. This was placed using a standard open Hassan technique which allowed entry into the peritoneal cavity under direct vision and without difficulty. A 12 mm port was placed and a pneumoperitoneum  established. The camera was then used to inspect the abdomen and there was no evidence of any intra-abdominal injuries or other abnormalities. The remaining abdominal ports were then placed. 8 mm robotic ports were placed in the right lower quadrant, left lower quadrant, and far left lateral abdominal wall. A 5 mm port was placed in the right upper quadrant and a 12 mm port was placed in the right lateral abdominal wall for laparoscopic assistance. All ports were placed under direct vision without difficulty. The surgical cart was then docked.   Utilizing the cautery scissors, the bladder was reflected posteriorly allowing entry into the space of Retzius and identification of the endopelvic fascia and prostate. The periprostatic fat was then removed from the prostate allowing full exposure of the endopelvic fascia. The endopelvic fascia was then incised from the apex back to the base of the prostate bilaterally and the underlying levator muscle fibers were swept laterally off the prostate thereby isolating the dorsal venous complex. The dorsal vein was then stapled and divided with a 45 mm Flex Echelon stapler. Attention then turned to the bladder neck which was divided anteriorly thereby allowing entry into the bladder and exposure of the urethral catheter. The catheter balloon was deflated and the catheter was brought into the operative field and used to retract the prostate anteriorly. The posterior bladder neck was then examined and was divided allowing further dissection between the bladder and prostate posteriorly until the vasa deferentia and seminal vessels were identified. The vasa deferentia were isolated, divided, and lifted anteriorly. The seminal vesicles were dissected down to their tips with care to control the seminal vascular arterial blood supply. These structures were then lifted anteriorly and the space between Denonvillier's fascia and the anterior rectum was  developed with a combination of  sharp and blunt dissection. This isolated the vascular pedicles of the prostate.  The lateral prostatic fascia was then sharply incised allowing release of the neurovascular bundles bilaterally. A partial nerve sparing procedure was performed on the right due to his higher volume disease and MRI findings while a full nerve sparing procedure was performed on the left. The vascular pedicles of the prostate were then ligated with Weck clips between the prostate and neurovascular bundles and divided with sharp cold scissor dissection resulting in neurovascular bundle preservation. The neurovascular bundles were then separated off the apex of the prostate and urethra bilaterally.  The urethra was then sharply transected allowing the prostate specimen to be disarticulated. The pelvis was copiously irrigated and hemostasis was ensured. There was no evidence for rectal injury.  Attention then turned to the right pelvic sidewall. The fibrofatty tissue between the external iliac vein, confluence of the iliac vessels, hypogastric artery, and Cooper's ligament was dissected free from the pelvic sidewall with care to preserve the obturator nerve. Weck clips were used for lymphostasis and hemostasis. An identical procedure was performed on the contralateral side and the lymphatic packets were removed for permanent pathologic analysis.  Attention then turned to the urethral anastomosis. A 2-0 Vicryl slip knot was placed between Denonvillier's fascia, the posterior bladder neck, and the posterior urethra to reapproximate these structures. A double-armed 3-0 Monocryl suture was then used to perform a 360 running tension-free anastomosis between the bladder neck and urethra. A new urethral catheter was then placed into the bladder and irrigated. There were no blood clots within the bladder and the anastomosis appeared to be watertight. A #19 Blake drain was then brought through the left lateral 8 mm port site and positioned  appropriately within the pelvis. It was secured to the skin with a nylon suture. The surgical cart was then undocked. The right lateral 12 mm port site was closed at the fascial level with a 0 Vicryl suture placed laparoscopically. All remaining ports were then removed under direct vision. The prostate specimen was removed intact within the Endopouch retrieval bag via the periumbilical camera port site. This fascial opening was closed with two running 0 Vicryl sutures. 0.25% Marcaine was then injected into all port sites and all incisions were reapproximated at the skin level with 4-0 Monocryl subcuticular sutures and Dermabond. The patient appeared to tolerate the procedure well and without complications. The patient was able to be extubated and transferred to the recovery unit in satisfactory condition.   Pryor Curia MD

## 2015-03-20 NOTE — Anesthesia Procedure Notes (Signed)
Procedure Name: Intubation Date/Time: 03/20/2015 7:27 AM Performed by: Danley Danker L Patient Re-evaluated:Patient Re-evaluated prior to inductionOxygen Delivery Method: Circle system utilized Preoxygenation: Pre-oxygenation with 100% oxygen Intubation Type: IV induction Ventilation: Mask ventilation without difficulty and Oral airway inserted - appropriate to patient size Laryngoscope Size: Miller and 3 Grade View: Grade I Tube type: Oral Tube size: 8.0 mm Number of attempts: 1 Airway Equipment and Method: Stylet Placement Confirmation: ETT inserted through vocal cords under direct vision,  positive ETCO2 and breath sounds checked- equal and bilateral Secured at: 21 cm Tube secured with: Tape Dental Injury: Teeth and Oropharynx as per pre-operative assessment

## 2015-03-20 NOTE — Progress Notes (Signed)
Post-op note  Subjective: The patient is doing well.  No complaints.  Still very sleepy.  No N/V  Objective: Vital signs in last 24 hours: Temp:  [97.6 F (36.4 C)-97.8 F (36.6 C)] 97.8 F (36.6 C) (07/21 1253) Pulse Rate:  [68-73] 72 (07/21 1253) Resp:  [11-16] 15 (07/21 1253) BP: (128-163)/(74-88) 129/79 mmHg (07/21 1253) SpO2:  [92 %-97 %] 93 % (07/21 1253) Weight:  [100.245 kg (221 lb)] 100.245 kg (221 lb) (07/21 1351)  Intake/Output from previous day:   Intake/Output this shift: Total I/O In: 3900 [I.V.:2900; IV Piggyback:1000] Out: 530 [Urine:200; Drains:30; Blood:300]  Physical Exam:  General: Alert and oriented. Abdomen: Soft, Nondistended. Incisions: Clean and dry. Urine: dark red  Lab Results:  Recent Labs  03/18/15 0935 03/20/15 1144  HGB 16.1 14.8  HCT 48.1 44.3    Assessment/Plan: POD#0   1) Continue to monitor  2) DVT prophy, clears, IS, amb, pain control   LOS: 0 days   Ever Gustafson 03/20/2015, 2:44 PM

## 2015-03-20 NOTE — Transfer of Care (Signed)
Immediate Anesthesia Transfer of Care Note  Patient: Fred Thornton  Procedure(s) Performed: Procedure(s): ROBOTIC ASSISTED LAPAROSCOPIC RADICAL PROSTATECTOMY (LEVEL 2) (N/A) BILATERAL PELVIC LYMPHADENECTOMY (Bilateral)  Patient Location: PACU  Anesthesia Type:General  Level of Consciousness: sedated  Airway & Oxygen Therapy: Patient Spontanous Breathing and Patient connected to face mask oxygen  Post-op Assessment: Report given to RN and Post -op Vital signs reviewed and stable  Post vital signs: Reviewed and stable  Last Vitals:  Filed Vitals:   03/20/15 0523  BP: 128/86  Pulse: 70  Temp: 36.5 C  Resp: 16    Complications: No apparent anesthesia complications

## 2015-03-21 ENCOUNTER — Encounter (HOSPITAL_COMMUNITY): Payer: Self-pay | Admitting: Urology

## 2015-03-21 LAB — HEMOGLOBIN AND HEMATOCRIT, BLOOD
HEMATOCRIT: 44.1 % (ref 39.0–52.0)
HEMOGLOBIN: 14.7 g/dL (ref 13.0–17.0)

## 2015-03-21 MED ORDER — BISACODYL 10 MG RE SUPP
10.0000 mg | Freq: Once | RECTAL | Status: AC
  Administered 2015-03-21: 10 mg via RECTAL
  Filled 2015-03-21: qty 1

## 2015-03-21 MED ORDER — HYDROCODONE-ACETAMINOPHEN 5-325 MG PO TABS: 1.0000 | ORAL_TABLET | Freq: Four times a day (QID) | ORAL | Status: DC | PRN

## 2015-03-21 NOTE — Progress Notes (Signed)
1 Day Post-Op Subjective: The patient is doing well.  No nausea or vomiting. Pain is adequately controlled. Ambulated in hall last night. No BM/flatus. No foley discomfort.  Objective: Vital signs in last 24 hours: Temp:  [97.6 F (36.4 C)-98.7 F (37.1 C)] 98.2 F (36.8 C) (07/22 0509) Pulse Rate:  [57-73] 57 (07/22 0509) Resp:  [11-16] 14 (07/22 0509) BP: (126-163)/(74-88) 133/76 mmHg (07/22 0509) SpO2:  [92 %-97 %] 96 % (07/22 0509) Weight:  [100.245 kg (221 lb)] 100.245 kg (221 lb) (07/21 1351)  Intake/Output from previous day: 07/21 0701 - 07/22 0700 In: 6032.5 [P.O.:700; I.V.:4332.5; IV Piggyback:1000] Out: 3070 [Urine:2650; Drains:120; Blood:300] Intake/Output this shift: Total I/O In: 1792.5 [P.O.:360; I.V.:1432.5] Out: 2500 [Urine:2450; Drains:50]  Physical Exam:  General: Alert and oriented. CV: RRR Lungs: Clear bilaterally. GI: Soft, Nondistended. Incisions: Clean, dry, and intact, JP serosang. Urine: Clear, yellow  Extremities: Nontender, no erythema, no edema.  Lab Results:  Recent Labs  03/18/15 0935 03/20/15 1144 03/21/15 0403  HGB 16.1 14.8 14.7  HCT 48.1 44.3 44.1      Assessment/Plan: POD# 1 s/p robotic prostatectomy with bilateral pelvic lymph node dissection. Doing well on expected post-operative course.  1) SL IVF 2) Ambulate, Incentive spirometry 3) Transition to oral pain medication 4) Dulcolax suppository 5) D/C pelvic drain 6) Plan for likely discharge later today     LOS: 1 day   Star Age 03/21/2015, 6:59 AM

## 2015-03-21 NOTE — Progress Notes (Signed)
Patient ID: Fred Thornton, male   DOB: [birth year], 69 y.o.   MRN: [id number]  1 Day Post-Op Subjective: The patient is doing well.  No nausea or vomiting. Pain is adequately controlled.  Objective: Vital signs in last 24 hours: Temp:  [97.6 F (36.4 C)-98.7 F (37.1 C)] 98.2 F (36.8 C) (07/22 0509) Pulse Rate:  [57-73] 57 (07/22 0509) Resp:  [11-16] 14 (07/22 0509) BP: (126-163)/(74-88) 133/76 mmHg (07/22 0509) SpO2:  [92 %-97 %] 96 % (07/22 0509) Weight:  [100.245 kg (221 lb)] 100.245 kg (221 lb) (07/21 1351)  Intake/Output from previous day: 07/21 0701 - 07/22 0700 In: 6032.5 [P.O.:700; I.V.:4332.5; IV Piggyback:1000] Out: 3070 [Urine:2650; Drains:120; Blood:300] Intake/Output this shift:    Physical Exam:  General: Alert and oriented. CV: RRR Lungs: Clear bilaterally. GI: Soft, Nondistended. Incisions: Clean, dry, and intact Urine: Clear Extremities: Nontender, no erythema, no edema.  Lab Results:  Recent Labs  03/18/15 0935 03/20/15 1144 03/21/15 0403  HGB 16.1 14.8 14.7  HCT 48.1 44.3 44.1      Assessment/Plan: POD# 1 s/p robotic prostatectomy.  1) SL IVF 2) Ambulate, Incentive spirometry 3) Transition to oral pain medication 4) Dulcolax suppository 5) D/C pelvic drain 6) Plan for likely discharge later today   Pryor Curia. MD   LOS: 1 day   Manar Smalling,LES 03/21/2015, 7:07 AM

## 2015-03-21 NOTE — Progress Notes (Signed)
Went over all foley and leg bag teaching with patient.  All questions answered.  Explained importance of taking prescriptions as prescribed. VSS. Prescriptions and AVS summary given.  Pt wheeled out by NT.

## 2015-03-23 NOTE — Anesthesia Postprocedure Evaluation (Signed)
  Anesthesia Post-op Note  Patient: Fred Thornton  Procedure(s) Performed: Procedure(s) (LRB): ROBOTIC ASSISTED LAPAROSCOPIC RADICAL PROSTATECTOMY (LEVEL 2) (N/A) BILATERAL PELVIC LYMPHADENECTOMY (Bilateral)  Patient Location: PACU  Anesthesia Type: General  Level of Consciousness: awake and alert   Airway and Oxygen Therapy: Patient Spontanous Breathing  Post-op Pain: mild  Post-op Assessment: Post-op Vital signs reviewed, Patient's Cardiovascular Status Stable, Respiratory Function Stable, Patent Airway and No signs of Nausea or vomiting  Last Vitals:  Filed Vitals:   03/21/15 1000  BP: 138/74  Pulse: 61  Temp: 36.7 C  Resp: 16    Post-op Vital Signs: stable   Complications: No apparent anesthesia complications

## 2015-04-21 DIAGNOSIS — C61 Malignant neoplasm of prostate: Secondary | ICD-10-CM | POA: Diagnosis not present

## 2015-04-21 DIAGNOSIS — M6281 Muscle weakness (generalized): Secondary | ICD-10-CM | POA: Diagnosis not present

## 2015-05-06 DIAGNOSIS — M6281 Muscle weakness (generalized): Secondary | ICD-10-CM | POA: Diagnosis not present

## 2015-05-06 DIAGNOSIS — C61 Malignant neoplasm of prostate: Secondary | ICD-10-CM | POA: Diagnosis not present

## 2015-05-08 DIAGNOSIS — C61 Malignant neoplasm of prostate: Secondary | ICD-10-CM | POA: Diagnosis not present

## 2015-05-14 DIAGNOSIS — C61 Malignant neoplasm of prostate: Secondary | ICD-10-CM | POA: Diagnosis not present

## 2015-05-14 DIAGNOSIS — N393 Stress incontinence (female) (male): Secondary | ICD-10-CM | POA: Diagnosis not present

## 2015-05-14 DIAGNOSIS — N529 Male erectile dysfunction, unspecified: Secondary | ICD-10-CM | POA: Diagnosis not present

## 2015-05-15 NOTE — Discharge Summary (Signed)
Date of admission: 03/20/2015  Date of discharge: 03/21/15  Admission diagnosis: Prostate Cancer  Discharge diagnosis: Prostate Cancer  History and Physical: For full details, please see admission history and physical. Briefly, Fred Thornton is a 69 y.o. gentleman with localized prostate cancer.  After discussing management/treatment options, he elected to proceed with surgical treatment.  Hospital Course: Fred Thornton was taken to the operating room on 03/20/2015 and underwent a robotic assisted laparoscopic radical prostatectomy. He tolerated this procedure well and without complications. Postoperatively, he was able to be transferred to a regular hospital room following recovery from anesthesia.  He was able to begin ambulating the night of surgery. He remained hemodynamically stable overnight.  He had excellent urine output with appropriately minimal output from his pelvic drain and his pelvic drain was removed on POD #1.  He was transitioned to oral pain medication, tolerated a clear liquid diet, and had met all discharge criteria and was able to be discharged home later on POD#1.  Laboratory values: No results for input(s): HGB, HCT in the last 72 hours.  Disposition: Home  Discharge instruction: He was instructed to be ambulatory but to refrain from heavy lifting, strenuous activity, or driving. He was instructed on urethral catheter care.  Discharge medications:     Medication List    STOP taking these medications        tamsulosin 0.4 MG Caps capsule  Commonly known as:  FLOMAX      TAKE these medications        HYDROcodone-acetaminophen 5-325 MG per tablet  Commonly known as:  NORCO  Take 1-2 tablets by mouth every 6 (six) hours as needed.     sulfamethoxazole-trimethoprim 800-160 MG per tablet  Commonly known as:  BACTRIM DS,SEPTRA DS  Take 1 tablet by mouth 2 (two) times daily. Start the day prior to foley removal appointment        Followup: He will followup in 1  week for catheter removal and to discuss his surgical pathology results.

## 2015-06-03 DIAGNOSIS — M6281 Muscle weakness (generalized): Secondary | ICD-10-CM | POA: Diagnosis not present

## 2015-06-03 DIAGNOSIS — C61 Malignant neoplasm of prostate: Secondary | ICD-10-CM | POA: Diagnosis not present

## 2015-07-01 DIAGNOSIS — M6281 Muscle weakness (generalized): Secondary | ICD-10-CM | POA: Diagnosis not present

## 2015-07-01 DIAGNOSIS — C61 Malignant neoplasm of prostate: Secondary | ICD-10-CM | POA: Diagnosis not present

## 2015-08-06 DIAGNOSIS — C61 Malignant neoplasm of prostate: Secondary | ICD-10-CM | POA: Diagnosis not present

## 2015-08-06 DIAGNOSIS — M6281 Muscle weakness (generalized): Secondary | ICD-10-CM | POA: Diagnosis not present

## 2015-08-15 DIAGNOSIS — Z0000 Encounter for general adult medical examination without abnormal findings: Secondary | ICD-10-CM | POA: Diagnosis not present

## 2015-08-15 DIAGNOSIS — Z23 Encounter for immunization: Secondary | ICD-10-CM | POA: Diagnosis not present

## 2015-08-15 DIAGNOSIS — Z136 Encounter for screening for cardiovascular disorders: Secondary | ICD-10-CM | POA: Diagnosis not present

## 2015-08-15 DIAGNOSIS — Z131 Encounter for screening for diabetes mellitus: Secondary | ICD-10-CM | POA: Diagnosis not present

## 2015-08-29 DIAGNOSIS — L814 Other melanin hyperpigmentation: Secondary | ICD-10-CM | POA: Diagnosis not present

## 2015-08-29 DIAGNOSIS — Z85820 Personal history of malignant melanoma of skin: Secondary | ICD-10-CM | POA: Diagnosis not present

## 2015-08-29 DIAGNOSIS — L821 Other seborrheic keratosis: Secondary | ICD-10-CM | POA: Diagnosis not present

## 2015-08-29 DIAGNOSIS — D1801 Hemangioma of skin and subcutaneous tissue: Secondary | ICD-10-CM | POA: Diagnosis not present

## 2015-08-29 DIAGNOSIS — L570 Actinic keratosis: Secondary | ICD-10-CM | POA: Diagnosis not present

## 2015-08-29 DIAGNOSIS — Z85828 Personal history of other malignant neoplasm of skin: Secondary | ICD-10-CM | POA: Diagnosis not present

## 2015-08-29 DIAGNOSIS — C4441 Basal cell carcinoma of skin of scalp and neck: Secondary | ICD-10-CM | POA: Diagnosis not present

## 2015-08-29 DIAGNOSIS — L820 Inflamed seborrheic keratosis: Secondary | ICD-10-CM | POA: Diagnosis not present

## 2016-11-13 IMAGING — MR MR PROSTATE WO/W CM
23 of 53 series · 23 of 53 positions shown · IV contrast (yes)
Comparison: Bone scan 12/12/2014.  Abdominal pelvic CT 12/12/2014.

CLINICAL DATA: Elevated PSA. Recent biopsy demonstrating Gleason
score of 7. Evaluate for neurovascular involvement. Preoperative
planning.

EXAM:
MR PROSTATE WITHOUT AND WITH CONTRAST
TECHNIQUE: Multiplanar multisequence MRI images were obtained of the pelvis
centered about the prostate. Pre and post contrast images were
obtained.
CONTRAST:  20mL MULTIHANCE GADOBENATE DIMEGLUMINE 529 MG/ML IV SOLN

[Series 3: bSSFP fat-sat · axial · 6.0mm · 0.86mm/px · 1 of 44 slices shown]
[im 1/44]
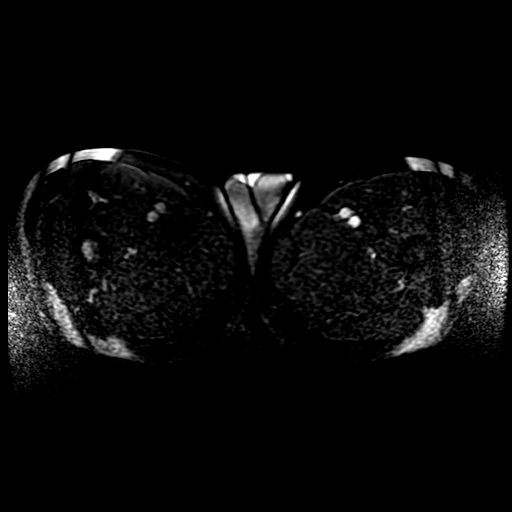

[Series 4: T1 · axial · 6.0mm · 0.86mm/px · 1 of 44 slices shown (1 of 2)]
[im 1/44]
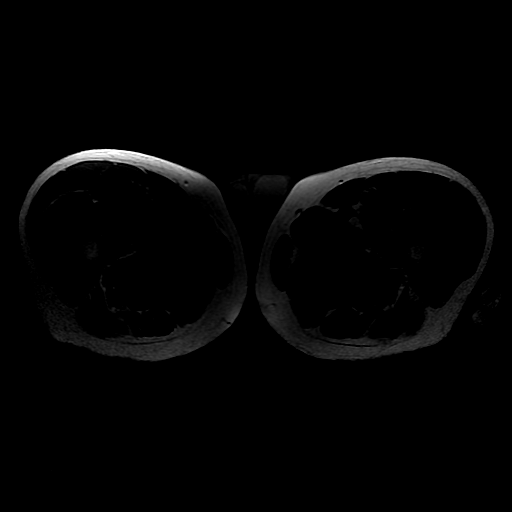

[Series 5: T2 · axial · 3.0mm · 0.29mm/px · 1 of 28 slices shown (1 of 3)]
[im 1/28]
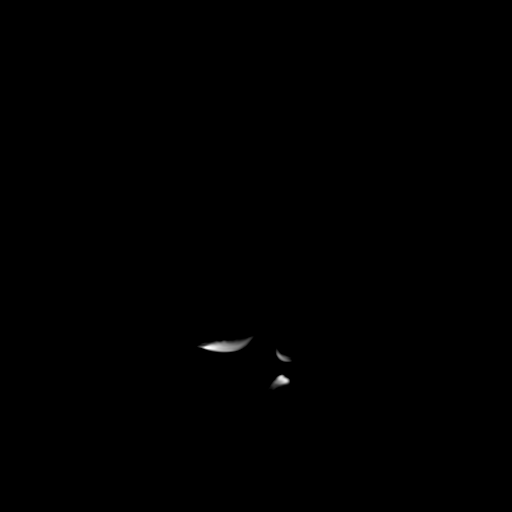

[Series 6: T1 · axial · 3.0mm · 0.29mm/px · 1 of 28 slices shown (2 of 2)]
[im 1/28]
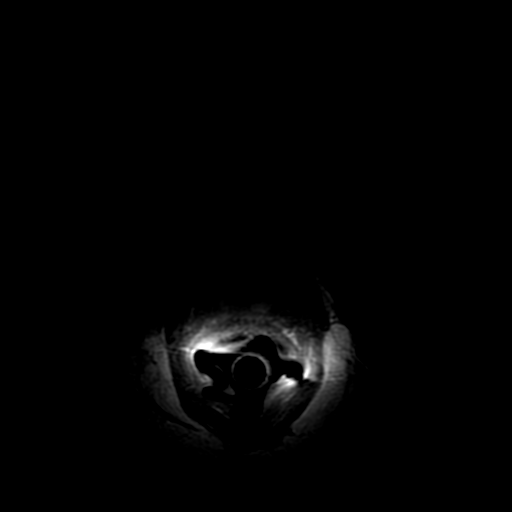

[Series 7: T2 · sagittal · 4.0mm · 0.29mm/px · 1 of 22 slices shown (2 of 3)]
[im 1/22]
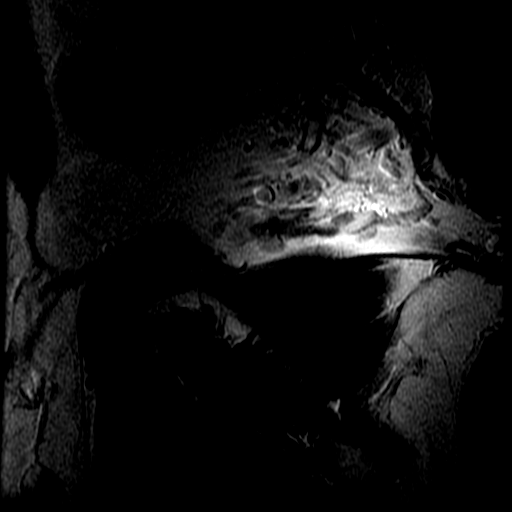

[Series 8: T2 · coronal · 4.0mm · 0.29mm/px · 1 of 18 slices shown (3 of 3)]
[im 1/18]
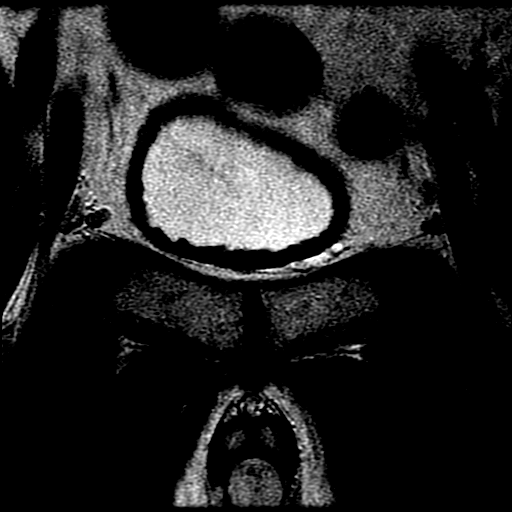

[Series 9: DWI · axial · 3.0mm · 0.59mm/px · 1 of 48 slices shown (1 of 6)]
[im 1/48]
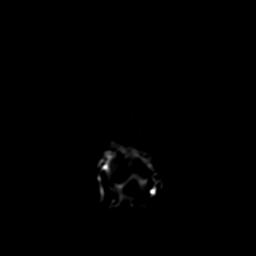

[Series 10: DWI · axial · 3.0mm · 0.59mm/px · 1 of 48 slices shown (2 of 6)]
[im 1/48]
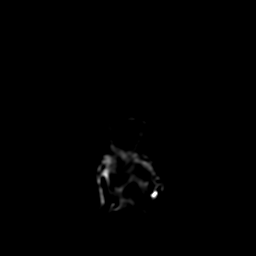

[Series 11: DWI · axial · 3.0mm · 0.59mm/px · 1 of 48 slices shown (3 of 6)]
[im 1/48]
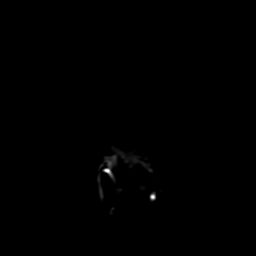

[Series 900: DWI · axial · 3.0mm · 0.59mm/px · 1 of 24 slices shown (4 of 6)]
[im 1/24]
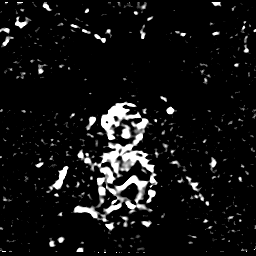

[Series 1000: DWI · axial · 3.0mm · 0.59mm/px · 1 of 24 slices shown (5 of 6)]
[im 1/24]
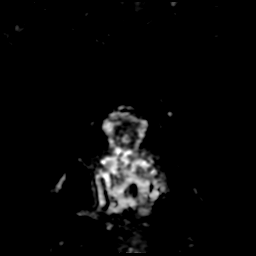

[Series 1100: DWI · axial · 3.0mm · 0.59mm/px · 1 of 24 slices shown (6 of 6)]
[im 1/24]
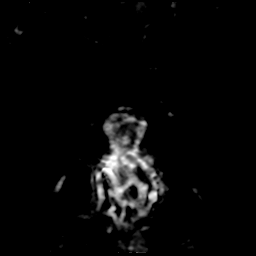

[((id)/(id)/1)-((id)/(id)/1) · axial · 3.0mm · 0.43mm/px · 1 of 71 slices shown (1 of 11)]
[im 1/71]
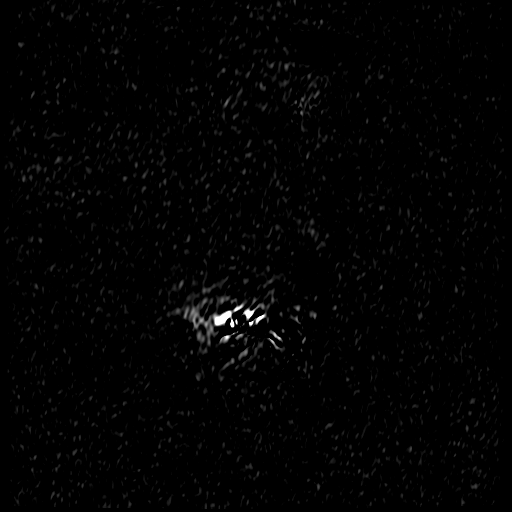

[((id)/(id)/1)-((id)/(id)/1) · axial · 3.0mm · 0.43mm/px · 1 of 72 slices shown (2 of 11)]
[im 1/72]
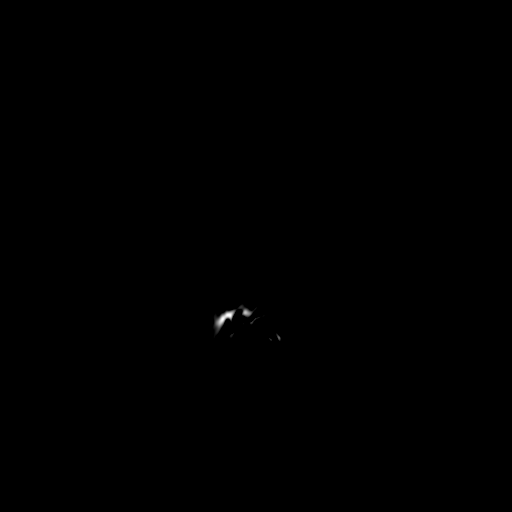

[((id)/(id)/1)-((id)/(id)/1) · axial · 3.0mm · 0.43mm/px · 1 of 72 slices shown (3 of 11)]
[im 1/72]
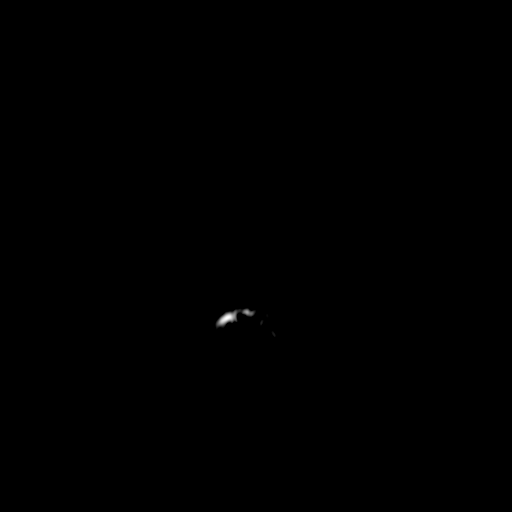

[((id)/(id)/1)-((id)/(id)/1) · axial · 3.0mm · 0.43mm/px · 1 of 72 slices shown (4 of 11)]
[im 1/72]
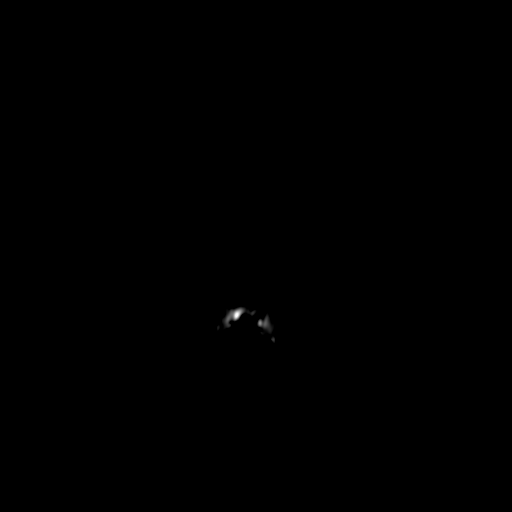

[((id)/(id)/1)-((id)/(id)/1) · axial · 3.0mm · 0.43mm/px · 1 of 72 slices shown (5 of 11)]
[im 1/72]
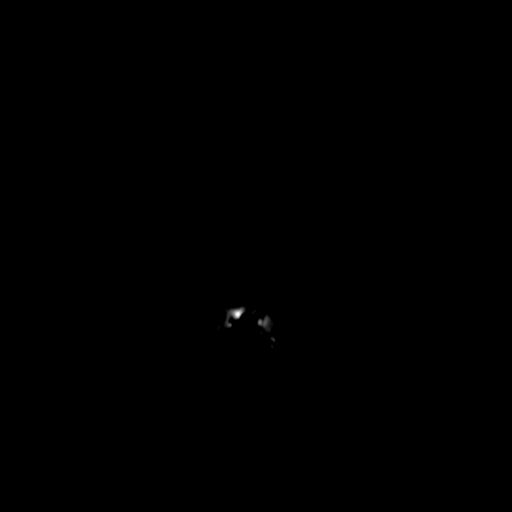

[((id)/(id)/1)-((id)/(id)/1) · axial · 3.0mm · 0.43mm/px · 1 of 72 slices shown (6 of 11)]
[im 1/72]
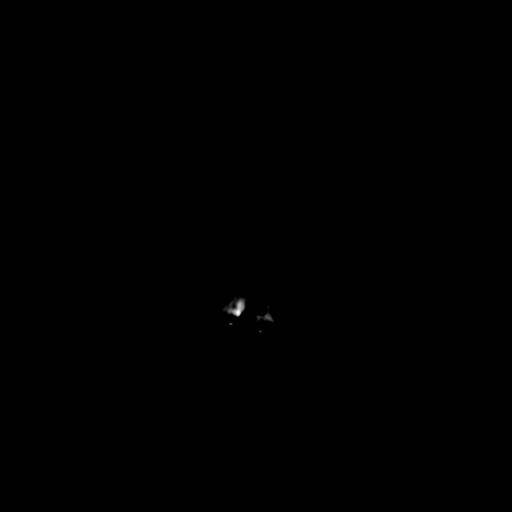

[((id)/(id)/1)-((id)/(id)/1) · axial · 3.0mm · 0.43mm/px · 1 of 72 slices shown (7 of 11)]
[im 1/72]
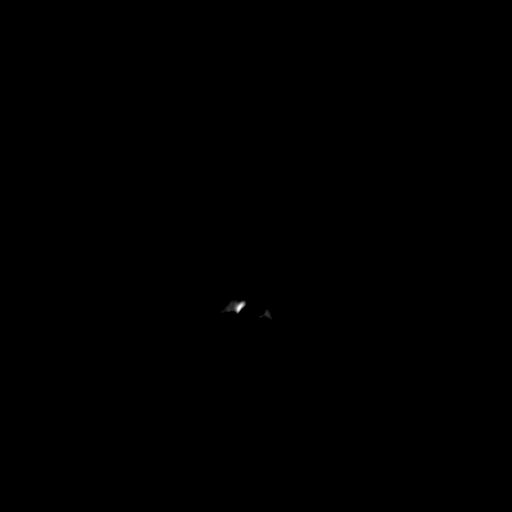

[((id)/(id)/1)-((id)/(id)/1) · axial · 3.0mm · 0.43mm/px · 1 of 72 slices shown (8 of 11)]
[im 1/72]
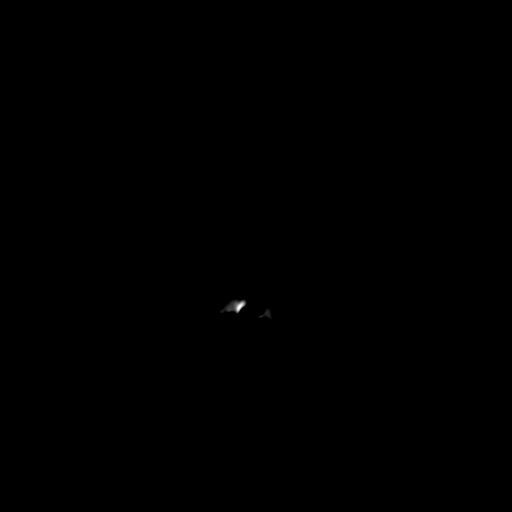

[((id)/(id)/1)-((id)/(id)/1) · axial · 3.0mm · 0.43mm/px · 1 of 72 slices shown (9 of 11)]
[im 1/72]
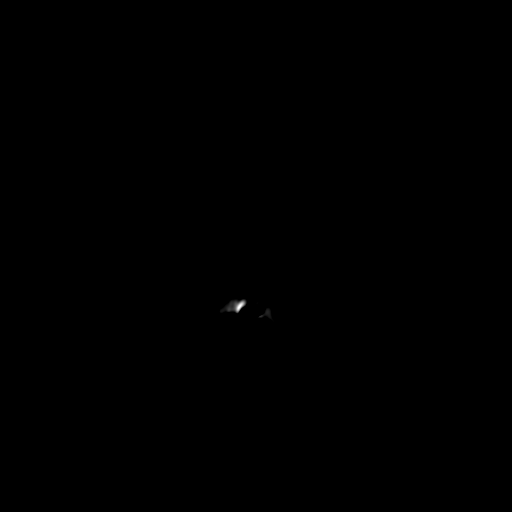

[((id)/(id)/1)-((id)/(id)/1) · axial · 3.0mm · 0.43mm/px · 1 of 72 slices shown (10 of 11)]
[im 1/72]
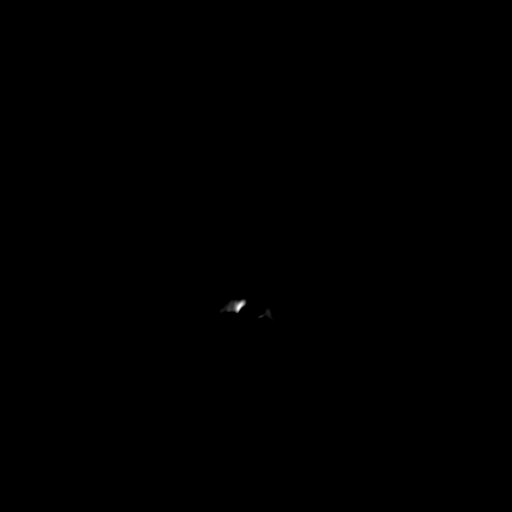

[((id)/(id)/1)-((id)/(id)/1) · axial · 3.0mm · 0.43mm/px · 1 of 72 slices shown (11 of 11)]
[im 1/72]
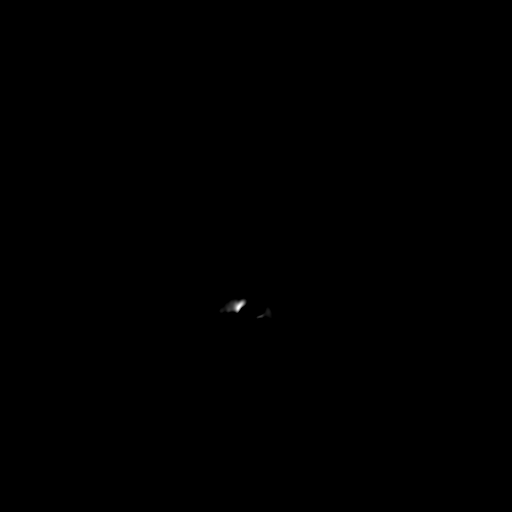

[23 of 53 positions shown; findings below may reference images not displayed]

FINDINGS: Prostate: Demonstrates mild central gland enlargement and
heterogeneity consistent with benign prostatic hyperplasia. The left
peripheral zone is normal. Heterogeneous T2 hypo intensity is
identified within the right lateral peripheral zone on image 19 of
series 5. This becomes somewhat more confluent extending into the
right more medial mid gland toward the apex. Example images 21 and
22 of series 5. This area is identified on images 7 through 9 of
series 7 and 4- 5 of series 8. Concurrent restricted diffusion
including on images 7 through 5 of series 1144. Early post-contrast
enhancement, including on images 43 through 51 of series [id number].

Transcapsular spread:  Absent

Seminal vesicle involvement: Absent

Neurovascular bundle involvement: Absent

Pelvic adenopathy: Absent

Bone metastasis: Absent

Other findings: No urinary bladder. Normal pelvic bowel loops,
without free pelvic fluid.
IMPRESSION: 1. Multiparametric abnormality involving the right mid to apical
gland, as detailed above. Restricted diffusion and early
post-contrast enhancement suggest relatively high-grade disease.
2. No evidence of locally advanced disease or pelvic metastasis.

## 2017-09-17 ENCOUNTER — Other Ambulatory Visit: Payer: Self-pay | Admitting: Family Medicine

## 2017-09-17 DIAGNOSIS — R748 Abnormal levels of other serum enzymes: Secondary | ICD-10-CM

## 2017-09-21 ENCOUNTER — Ambulatory Visit
Admission: RE | Admit: 2017-09-21 | Discharge: 2017-09-21 | Disposition: A | Discharge status: Home / Self Care | Payer: Medicare Other | Source: Ambulatory Visit | Attending: Family Medicine | Admitting: Family Medicine

## 2017-09-21 DIAGNOSIS — R748 Abnormal levels of other serum enzymes: Secondary | ICD-10-CM

## 2017-12-12 ENCOUNTER — Encounter (HOSPITAL_COMMUNITY): Payer: Self-pay | Admitting: *Deleted

## 2017-12-27 NOTE — H&P (Signed)
TOTAL HIP ADMISSION H&P  Patient is admitted for right total hip arthroplasty, anterior approach.  Subjective:  Chief Complaint:  Right hip primary OA / pain  HPI: Fred Thornton, 72 y.o. male, has a history of pain and functional disability in the right hip(s) due to arthritis and patient has failed non-surgical conservative treatments for greater than 12 weeks to include NSAID's and/or analgesics, corticosteriod injections and activity modification.  Onset of symptoms was gradual starting ~1.5 years ago with gradually worsening course since that time.The patient noted no past surgery on the right hip(s).  Patient currently rates pain in the right hip at 7 out of 10 with activity. Patient has night pain, worsening of pain with activity and weight bearing, trendelenberg gait, pain that interfers with activities of daily living and pain with passive range of motion. Patient has evidence of periarticular osteophytes and joint space narrowing by imaging studies. This condition presents safety issues increasing the risk of falls.   There is no current active infection.  Risks, benefits and expectations were discussed with the patient.  Risks including but not limited to the risk of anesthesia, blood clots, nerve damage, blood vessel damage, failure of the prosthesis, infection and up to and including death.  Patient understand the risks, benefits and expectations and wishes to proceed with surgery.   PCP: Lawerance Cruel, MD  D/C Plans:       Home  Post-op Meds:       No Rx given   Tranexamic Acid:      To be given - IV   Decadron:      Is to be given  FYI:      ASA  Norco  DME:   Rx given for - RW   PT:   No PT    Patient Active Problem List   Diagnosis Date Noted  . Prostate cancer ([place]) 03/20/2015  . Malignant neoplasm of prostate ([place]) 01/02/2015   Past Medical History:  Diagnosis Date  . Asthma    hx of in childhood   . Hypercholesteremia   . Malignant neoplasm ([facility])     Skin  . Melanoma ([place])    right upper arm  . Microhematuria   . Numbness    right first finger   . Prostate cancer ([place]) 11/15/14  . Urinary straining   . Weak urinary stream     Past Surgical History:  Procedure Laterality Date  . hx of dermatological surgery    . hx of Metacarpal Amputation     and Index finger  . LYMPHADENECTOMY Bilateral 03/20/2015   Procedure: BILATERAL PELVIC LYMPHADENECTOMY;  Surgeon: Raynelle Bring, MD;  Location: [facility];  Service: Urology;  Laterality: Bilateral;  . PROSTATE BIOPSY  11/15/14  . ROBOT ASSISTED LAPAROSCOPIC RADICAL PROSTATECTOMY N/A 03/20/2015   Procedure: ROBOTIC ASSISTED LAPAROSCOPIC RADICAL PROSTATECTOMY (LEVEL 2);  Surgeon: Raynelle Bring, MD;  Location: [facility];  Service: Urology;  Laterality: N/A;    No current facility-administered medications for this encounter.    Current Outpatient Medications  Medication Sig Dispense Refill Last Dose  . HYDROcodone-acetaminophen (NORCO) 5-325 MG per tablet Take 1-2 tablets by mouth every 6 (six) hours as needed. 30 tablet 0   . sulfamethoxazole-trimethoprim (BACTRIM DS,SEPTRA DS) 800-160 MG per tablet Take 1 tablet by mouth 2 (two) times daily. Start the day prior to foley removal appointment 6 tablet 0    Allergies  Allergen Reactions  . Amoxicillin Rash    Social History   Tobacco Use  .  Smoking status: Never Smoker  . Smokeless tobacco: Never Used  Substance Use Topics  . Alcohol use: Yes    Alcohol/week: 0.0 oz    Comment: drinks 2 glasses of wine per week    Family History  Problem Relation Age of Onset  . Heart block Father   . Breast cancer Mother      Review of Systems  Constitutional: Negative.   HENT: Negative.   Eyes: Negative.   Respiratory: Negative.   Cardiovascular: Negative.   Gastrointestinal: Negative.   Genitourinary: Negative.   Musculoskeletal: Positive for joint pain.  Skin: Negative.   Neurological: Negative.   Endo/Heme/Allergies: Negative.    Psychiatric/Behavioral: Negative.     Objective:  Physical Exam  Constitutional: He is oriented to person, place, and time. He appears well-developed.  HENT:  Head: Normocephalic.  Eyes: Pupils are equal, round, and reactive to light.  Neck: Neck supple. No JVD present. No tracheal deviation present. No thyromegaly present.  Cardiovascular: Normal rate, regular rhythm and intact distal pulses.  Respiratory: Effort normal and breath sounds normal. No respiratory distress. He has no wheezes.  GI: Soft. There is no tenderness. There is no guarding.  Musculoskeletal:       Right hip: He exhibits decreased range of motion, decreased strength, tenderness and bony tenderness. He exhibits no swelling, no deformity and no laceration.  Lymphadenopathy:    He has no cervical adenopathy.  Neurological: He is alert and oriented to person, place, and time. A sensory deficit (occassional numness in the left hand) is present.  Skin: Skin is warm and dry.  Psychiatric: He has a normal mood and affect.      Labs:  Estimated body mass index is 28.76 kg/m as calculated from the following:   Height as of 03/20/15: 6' 1.5" (1.867 m).   Weight as of 03/20/15: 100.2 kg (221 lb).   Imaging Review Plain radiographs demonstrate severe degenerative joint disease of the right hip(s). The bone quality appears to be good for age and reported activity level.    Preoperative templating of the joint replacement has been completed, documented, and submitted to the Operating Room personnel in order to optimize intra-operative equipment management.       Assessment/Plan:  End stage arthritis, right hip(s)  The patient history, physical examination, clinical judgement of the provider and imaging studies are consistent with end stage degenerative joint disease of the right hip(s) and total hip arthroplasty is deemed medically necessary. The treatment options including medical management, injection therapy,  arthroscopy and arthroplasty were discussed at length. The risks and benefits of total hip arthroplasty were presented and reviewed. The risks due to aseptic loosening, infection, stiffness, dislocation/subluxation,  thromboembolic complications and other imponderables were discussed.  The patient acknowledged the explanation, agreed to proceed with the plan and consent was signed. Patient is being admitted for inpatient treatment for surgery, pain control, PT, OT, prophylactic antibiotics, VTE prophylaxis, progressive ambulation and ADL's and discharge planning.The patient is planning to be discharged home.    [place] Judie Hollick   PA-C  12/27/2017, 3:29 PM

## 2018-01-02 NOTE — Patient Instructions (Signed)
Fred Thornton  01/02/2018   Your procedure is scheduled on: Tuesday 01/10/2018  Report to [facility] Main  Entrance             Report to admitting at  0530  AM    Call this number if you have problems the morning of surgery [phone]    Remember: Do not eat food or drink liquids :After Midnight.     Take these medicines the morning of surgery with A SIP OF WATER: Atorvastatin                                You may not have any metal on your body including hair pins and              piercings  Do not wear jewelry, make-up, lotions, powders or perfumes, deodorant                         Men may shave face and neck.   Do not bring valuables to the hospital. [place].  Contacts, dentures or bridgework may not be worn into surgery.  Leave suitcase in the car. After surgery it may be brought to your room.                  Please read over the following fact sheets you were given: _____________________________________________________________________             [facility] - Preparing for Surgery Before surgery, you can play an important role.  Because skin is not sterile, your skin needs to be as free of germs as possible.  You can reduce the number of germs on your skin by washing with CHG (chlorahexidine gluconate) soap before surgery.  CHG is an antiseptic cleaner which kills germs and bonds with the skin to continue killing germs even after washing. Please DO NOT use if you have an allergy to CHG or antibacterial soaps.  If your skin becomes reddened/irritated stop using the CHG and inform your nurse when you arrive at Short Stay. Do not shave (including legs and underarms) for at least 48 hours prior to the first CHG shower.  You may shave your face/neck. Please follow these instructions carefully:  1.  Shower with CHG Soap the night before surgery and the  morning of Surgery.  2.  If you choose to  wash your hair, wash your hair first as usual with your  normal  shampoo.  3.  After you shampoo, rinse your hair and body thoroughly to remove the  shampoo.                           4.  Use CHG as you would any other liquid soap.  You can apply chg directly  to the skin and wash                       Gently with a scrungie or clean washcloth.  5.  Apply the CHG Soap to your body ONLY FROM THE NECK DOWN.   Do not use on face/ open  Wound or open sores. Avoid contact with eyes, ears mouth and genitals (private parts).                       Wash face,  Genitals (private parts) with your normal soap.             6.  Wash thoroughly, paying special attention to the area where your surgery  will be performed.  7.  Thoroughly rinse your body with warm water from the neck down.  8.  DO NOT shower/wash with your normal soap after using and rinsing off  the CHG Soap.                9.  Pat yourself dry with a clean towel.            10.  Wear clean pajamas.            11.  Place clean sheets on your bed the night of your first shower and do not  sleep with pets. Day of Surgery : Do not apply any lotions/deodorants the morning of surgery.  Please wear clean clothes to the hospital/surgery center.  FAILURE TO FOLLOW THESE INSTRUCTIONS MAY RESULT IN THE CANCELLATION OF YOUR SURGERY PATIENT SIGNATURE_________________________________  NURSE SIGNATURE__________________________________  ________________________________________________________________________   Fred Thornton  An incentive spirometer is a tool that can help keep your lungs clear and active. This tool measures how well you are filling your lungs with each breath. Taking long deep breaths may help reverse or decrease the chance of developing breathing (pulmonary) problems (especially infection) following:  A long period of time when you are unable to move or be active. BEFORE THE PROCEDURE   If the  spirometer includes an indicator to show your best effort, your nurse or respiratory therapist will set it to a desired goal.  If possible, sit up straight or lean slightly forward. Try not to slouch.  Hold the incentive spirometer in an upright position. INSTRUCTIONS FOR USE  1. Sit on the edge of your bed if possible, or sit up as far as you can in bed or on a chair. 2. Hold the incentive spirometer in an upright position. 3. Breathe out normally. 4. Place the mouthpiece in your mouth and seal your lips tightly around it. 5. Breathe in slowly and as deeply as possible, raising the piston or the ball toward the top of the column. 6. Hold your breath for 3-5 seconds or for as long as possible. Allow the piston or ball to fall to the bottom of the column. 7. Remove the mouthpiece from your mouth and breathe out normally. 8. Rest for a few seconds and repeat Steps 1 through 7 at least 10 times every 1-2 hours when you are awake. Take your time and take a few normal breaths between deep breaths. 9. The spirometer may include an indicator to show your best effort. Use the indicator as a goal to work toward during each repetition. 10. After each set of 10 deep breaths, practice coughing to be sure your lungs are clear. If you have an incision (the cut made at the time of surgery), support your incision when coughing by placing a pillow or rolled up towels firmly against it. Once you are able to get out of bed, walk around indoors and cough well. You may stop using the incentive spirometer when instructed by your caregiver.  RISKS AND COMPLICATIONS  Take your time so you do not get  dizzy or light-headed.  If you are in pain, you may need to take or ask for pain medication before doing incentive spirometry. It is harder to take a deep breath if you are having pain. AFTER USE  Rest and breathe slowly and easily.  It can be helpful to keep track of a log of your progress. Your caregiver can provide  you with a simple table to help with this. If you are using the spirometer at home, follow these instructions: [place] IF:   You are having difficultly using the spirometer.  You have trouble using the spirometer as often as instructed.  Your pain medication is not giving enough relief while using the spirometer.  You develop fever of 100.5 F (38.1 C) or higher. SEEK IMMEDIATE MEDICAL CARE IF:   You cough up bloody sputum that had not been present before.  You develop fever of 102 F (38.9 C) or greater.  You develop worsening pain at or near the incision site. MAKE SURE YOU:   Understand these instructions.  Will watch your condition.  Will get help right away if you are not doing well or get worse. Document Released: 12/27/2006 Document Revised: 11/08/2011 Document Reviewed: 02/27/2007 Exit[employer] Patient Information 2014 [employer], [place].   ________________________________________________________________________  WHAT IS A BLOOD TRANSFUSION? Blood Transfusion Information  A transfusion is the replacement of blood or some of its parts. Blood is made up of multiple cells which provide different functions.  Red blood cells carry oxygen and are used for blood loss replacement.  White blood cells fight against infection.  Platelets control bleeding.  Plasma helps clot blood.  Other blood products are available for specialized needs, such as hemophilia or other clotting disorders. BEFORE THE TRANSFUSION  Who gives blood for transfusions?   Healthy volunteers who are fully evaluated to make sure their blood is safe. This is blood bank blood. Transfusion therapy is the safest it has ever been in the practice of medicine. Before blood is taken from a donor, a complete history is taken to make sure that person has no history of diseases nor engages in risky social behavior (examples are intravenous drug use or sexual activity with multiple partners). The donor's  travel history is screened to minimize risk of transmitting infections, such as malaria. The donated blood is tested for signs of infectious diseases, such as HIV and hepatitis. The blood is then tested to be sure it is compatible with you in order to minimize the chance of a transfusion reaction. If you or a relative donates blood, this is often done in anticipation of surgery and is not appropriate for emergency situations. It takes many days to process the donated blood. RISKS AND COMPLICATIONS Although transfusion therapy is very safe and saves many lives, the main dangers of transfusion include:   Getting an infectious disease.  Developing a transfusion reaction. This is an allergic reaction to something in the blood you were given. Every precaution is taken to prevent this. The decision to have a blood transfusion has been considered carefully by your caregiver before blood is given. Blood is not given unless the benefits outweigh the risks. AFTER THE TRANSFUSION  Right after receiving a blood transfusion, you will usually feel much better and more energetic. This is especially true if your red blood cells have gotten low (anemic). The transfusion raises the level of the red blood cells which carry oxygen, and this usually causes an energy increase.  The nurse administering the transfusion will  monitor you carefully for complications. HOME CARE INSTRUCTIONS  No special instructions are needed after a transfusion. You may find your energy is better. Speak with your caregiver about any limitations on activity for underlying diseases you may have. SEEK MEDICAL CARE IF:   Your condition is not improving after your transfusion.  You develop redness or irritation at the intravenous (IV) site. SEEK IMMEDIATE MEDICAL CARE IF:  Any of the following symptoms occur over the next 12 hours:  Shaking chills.  You have a temperature by mouth above 102 F (38.9 C), not controlled by  medicine.  Chest, back, or muscle pain.  People around you feel you are not acting correctly or are confused.  Shortness of breath or difficulty breathing.  Dizziness and fainting.  You get a rash or develop hives.  You have a decrease in urine output.  Your urine turns a dark color or changes to pink, red, or brown. Any of the following symptoms occur over the next 10 days:  You have a temperature by mouth above 102 F (38.9 C), not controlled by medicine.  Shortness of breath.  Weakness after normal activity.  The white part of the eye turns yellow (jaundice).  You have a decrease in the amount of urine or are urinating less often.  Your urine turns a dark color or changes to pink, red, or brown. Document Released: 08/13/2000 Document Revised: 11/08/2011 Document Reviewed: 04/01/2008 [facility] Patient Information 2014 [place], [place].  _______________________________________________________________________

## 2018-01-04 ENCOUNTER — Other Ambulatory Visit: Payer: Self-pay

## 2018-01-04 ENCOUNTER — Encounter (HOSPITAL_COMMUNITY): Payer: Self-pay

## 2018-01-04 ENCOUNTER — Encounter (HOSPITAL_COMMUNITY)
Admission: RE | Admit: 2018-01-04 | Discharge: 2018-01-04 | Disposition: A | Discharge status: Home / Self Care | Payer: Medicare Other | Source: Ambulatory Visit | Attending: Orthopedic Surgery | Admitting: Orthopedic Surgery

## 2018-01-04 DIAGNOSIS — M1611 Unilateral primary osteoarthritis, right hip: Secondary | ICD-10-CM | POA: Diagnosis not present

## 2018-01-04 DIAGNOSIS — Z01818 Encounter for other preprocedural examination: Secondary | ICD-10-CM | POA: Insufficient documentation

## 2018-01-04 LAB — BASIC METABOLIC PANEL
ANION GAP: 9 (ref 5–15)
BUN: 16 mg/dL (ref 6–20)
CALCIUM: 9.2 mg/dL (ref 8.9–10.3)
CO2: 20 mmol/L — ABNORMAL LOW (ref 22–32)
Chloride: 109 mmol/L (ref 101–111)
Creatinine, Ser: 1.19 mg/dL (ref 0.61–1.24)
Glucose, Bld: 155 mg/dL — ABNORMAL HIGH (ref 65–99)
POTASSIUM: 3.9 mmol/L (ref 3.5–5.1)
SODIUM: 138 mmol/L (ref 135–145)

## 2018-01-04 LAB — CBC
HEMATOCRIT: 49.1 % (ref 39.0–52.0)
HEMOGLOBIN: 16.7 g/dL (ref 13.0–17.0)
MCH: 31.7 pg (ref 26.0–34.0)
MCHC: 34 g/dL (ref 30.0–36.0)
MCV: 93.3 fL (ref 78.0–100.0)
Platelets: 170 10*3/uL (ref 150–400)
RBC: 5.26 MIL/uL (ref 4.22–5.81)
RDW: 12.9 % (ref 11.5–15.5)
WBC: 5.8 10*3/uL (ref 4.0–10.5)

## 2018-01-04 LAB — SURGICAL PCR SCREEN
MRSA, PCR: NEGATIVE
STAPHYLOCOCCUS AUREUS: NEGATIVE

## 2018-01-09 MED ORDER — TRANEXAMIC ACID 1000 MG/10ML IV SOLN
1000.0000 mg | INTRAVENOUS | Status: AC
  Administered 2018-01-10: 1000 mg via INTRAVENOUS
  Filled 2018-01-09: qty 1100

## 2018-01-09 NOTE — Anesthesia Preprocedure Evaluation (Addendum)
Anesthesia Evaluation  Patient identified by MRN, date of birth, ID band Patient awake    Reviewed: Allergy & Precautions, NPO status , Patient's Chart, lab work & pertinent test results  History of Anesthesia Complications Negative for: history of anesthetic complications  Airway Mallampati: II  TM Distance: >3 FB Neck ROM: Full    Dental no notable dental hx. (+) Dental Advisory Given   Pulmonary asthma ,    Pulmonary exam normal        Cardiovascular negative cardio ROS Normal cardiovascular exam     Neuro/Psych negative neurological ROS  negative psych ROS   GI/Hepatic negative GI ROS, Neg liver ROS,   Endo/Other  negative endocrine ROS  Renal/GU negative Renal ROS  negative genitourinary   Musculoskeletal negative musculoskeletal ROS (+)   Abdominal   Peds negative pediatric ROS (+)  Hematology negative hematology ROS (+)   Anesthesia Other Findings Prostate Ca  Reproductive/Obstetrics negative OB ROS                            Anesthesia Physical  Anesthesia Plan  ASA: II  Anesthesia Plan: Spinal and MAC   Post-op Pain Management:    Induction:   PONV Risk Score and Plan: 2 and Ondansetron and Propofol infusion  Airway Management Planned: Natural Airway  Additional Equipment:   Intra-op Plan:   Post-operative Plan:   Informed Consent: I have reviewed the patients History and Physical, chart, labs and discussed the procedure including the risks, benefits and alternatives for the proposed anesthesia with the patient or authorized representative who has indicated his/her understanding and acceptance.   Dental advisory given  Plan Discussed with: CRNA and Anesthesiologist  Anesthesia Plan Comments:        Anesthesia Quick Evaluation

## 2018-01-10 ENCOUNTER — Encounter (HOSPITAL_COMMUNITY): Payer: Self-pay

## 2018-01-10 ENCOUNTER — Encounter (HOSPITAL_COMMUNITY)
Admission: RE | Disposition: A | Discharge status: Home / Self Care | Payer: Self-pay | Source: Ambulatory Visit | Attending: Orthopedic Surgery

## 2018-01-10 ENCOUNTER — Inpatient Hospital Stay (HOSPITAL_COMMUNITY): Payer: Medicare Other

## 2018-01-10 ENCOUNTER — Inpatient Hospital Stay (HOSPITAL_COMMUNITY): Payer: Medicare Other | Admitting: Certified Registered Nurse Anesthetist

## 2018-01-10 ENCOUNTER — Other Ambulatory Visit: Payer: Self-pay

## 2018-01-10 ENCOUNTER — Inpatient Hospital Stay (HOSPITAL_COMMUNITY)
Admission: RE | Admit: 2018-01-10 | Discharge: 2018-01-11 | DRG: 470 | Disposition: A | Discharge status: Home / Self Care | Payer: Medicare Other | Source: Ambulatory Visit | Attending: Orthopedic Surgery | Admitting: Orthopedic Surgery

## 2018-01-10 DIAGNOSIS — Z96641 Presence of right artificial hip joint: Secondary | ICD-10-CM

## 2018-01-10 DIAGNOSIS — Z8546 Personal history of malignant neoplasm of prostate: Secondary | ICD-10-CM | POA: Diagnosis not present

## 2018-01-10 DIAGNOSIS — Z96649 Presence of unspecified artificial hip joint: Secondary | ICD-10-CM

## 2018-01-10 DIAGNOSIS — E7800 Pure hypercholesterolemia, unspecified: Secondary | ICD-10-CM | POA: Diagnosis present

## 2018-01-10 DIAGNOSIS — E669 Obesity, unspecified: Secondary | ICD-10-CM | POA: Diagnosis present

## 2018-01-10 DIAGNOSIS — M1611 Unilateral primary osteoarthritis, right hip: Secondary | ICD-10-CM | POA: Diagnosis present

## 2018-01-10 DIAGNOSIS — Z6830 Body mass index (BMI) 30.0-30.9, adult: Secondary | ICD-10-CM | POA: Diagnosis not present

## 2018-01-10 DIAGNOSIS — Z85820 Personal history of malignant melanoma of skin: Secondary | ICD-10-CM | POA: Diagnosis not present

## 2018-01-10 HISTORY — PX: TOTAL HIP ARTHROPLASTY: SHX124

## 2018-01-10 LAB — TYPE AND SCREEN
ABO/RH(D): O NEG
Antibody Screen: NEGATIVE

## 2018-01-10 SURGERY — ARTHROPLASTY, HIP, TOTAL, ANTERIOR APPROACH
Anesthesia: Monitor Anesthesia Care | Site: Hip | Laterality: Right

## 2018-01-10 MED ORDER — PROPOFOL 10 MG/ML IV BOLUS
INTRAVENOUS | Status: AC
  Filled 2018-01-10: qty 60

## 2018-01-10 MED ORDER — ALUM & MAG HYDROXIDE-SIMETH 200-200-20 MG/5ML PO SUSP: 15.0000 mL | ORAL | Status: DC | PRN

## 2018-01-10 MED ORDER — FENTANYL CITRATE (PF) 100 MCG/2ML IJ SOLN
INTRAMUSCULAR | Status: AC
  Filled 2018-01-10: qty 2

## 2018-01-10 MED ORDER — FENTANYL CITRATE (PF) 100 MCG/2ML IJ SOLN
INTRAMUSCULAR | Status: DC | PRN
  Administered 2018-01-10: 50 ug via INTRAVENOUS

## 2018-01-10 MED ORDER — POLYETHYLENE GLYCOL 3350 17 G PO PACK
17.0000 g | PACK | Freq: Two times a day (BID) | ORAL | Status: DC
  Administered 2018-01-10 – 2018-01-11 (×2): 17 g via ORAL
  Filled 2018-01-10 (×2): qty 1

## 2018-01-10 MED ORDER — EPHEDRINE 5 MG/ML INJ
INTRAVENOUS | Status: AC
  Filled 2018-01-10: qty 10

## 2018-01-10 MED ORDER — ASPIRIN 81 MG PO CHEW: 81.0000 mg | CHEWABLE_TABLET | Freq: Two times a day (BID) | ORAL | 0 refills | Status: AC

## 2018-01-10 MED ORDER — METHOCARBAMOL 500 MG PO TABS
500.0000 mg | ORAL_TABLET | Freq: Four times a day (QID) | ORAL | Status: DC | PRN
  Administered 2018-01-10: 500 mg via ORAL
  Filled 2018-01-10: qty 1

## 2018-01-10 MED ORDER — PHENOL 1.4 % MT LIQD: 1.0000 | OROMUCOSAL | Status: DC | PRN

## 2018-01-10 MED ORDER — BUPIVACAINE IN DEXTROSE 0.75-8.25 % IT SOLN
INTRATHECAL | Status: DC | PRN
  Administered 2018-01-10: 2 mL via INTRATHECAL

## 2018-01-10 MED ORDER — PROPOFOL 10 MG/ML IV BOLUS
INTRAVENOUS | Status: DC | PRN
  Administered 2018-01-10: 20 mg via INTRAVENOUS
  Administered 2018-01-10: 10 mg via INTRAVENOUS
  Administered 2018-01-10: 20 mg via INTRAVENOUS
  Administered 2018-01-10 (×5): 10 mg via INTRAVENOUS

## 2018-01-10 MED ORDER — METHOCARBAMOL 500 MG PO TABS: 500.0000 mg | ORAL_TABLET | Freq: Four times a day (QID) | ORAL | 0 refills | Status: DC | PRN

## 2018-01-10 MED ORDER — LACTATED RINGERS IV SOLN
INTRAVENOUS | Status: DC | PRN
  Administered 2018-01-10 (×2): via INTRAVENOUS

## 2018-01-10 MED ORDER — ACETAMINOPHEN 325 MG PO TABS: 325.0000 mg | ORAL_TABLET | Freq: Four times a day (QID) | ORAL | Status: DC | PRN

## 2018-01-10 MED ORDER — METOCLOPRAMIDE HCL 5 MG PO TABS: 5.0000 mg | ORAL_TABLET | Freq: Three times a day (TID) | ORAL | Status: DC | PRN

## 2018-01-10 MED ORDER — DEXAMETHASONE SODIUM PHOSPHATE 10 MG/ML IJ SOLN
INTRAMUSCULAR | Status: AC
  Filled 2018-01-10: qty 1

## 2018-01-10 MED ORDER — DEXAMETHASONE SODIUM PHOSPHATE 10 MG/ML IJ SOLN
10.0000 mg | Freq: Once | INTRAMUSCULAR | Status: AC
  Administered 2018-01-10: 10 mg via INTRAVENOUS

## 2018-01-10 MED ORDER — FENTANYL CITRATE (PF) 100 MCG/2ML IJ SOLN: 25.0000 ug | INTRAMUSCULAR | Status: DC | PRN

## 2018-01-10 MED ORDER — DOCUSATE SODIUM 100 MG PO CAPS
100.0000 mg | ORAL_CAPSULE | Freq: Two times a day (BID) | ORAL | Status: DC
  Administered 2018-01-10 – 2018-01-11 (×2): 100 mg via ORAL
  Filled 2018-01-10 (×2): qty 1

## 2018-01-10 MED ORDER — DOCUSATE SODIUM 100 MG PO CAPS: 100.0000 mg | ORAL_CAPSULE | Freq: Two times a day (BID) | ORAL | 0 refills | Status: DC

## 2018-01-10 MED ORDER — EPHEDRINE SULFATE-NACL 50-0.9 MG/10ML-% IV SOSY
PREFILLED_SYRINGE | INTRAVENOUS | Status: DC | PRN
  Administered 2018-01-10: 10 mg via INTRAVENOUS
  Administered 2018-01-10 (×2): 5 mg via INTRAVENOUS

## 2018-01-10 MED ORDER — MENTHOL 3 MG MT LOZG: 1.0000 | LOZENGE | OROMUCOSAL | Status: DC | PRN

## 2018-01-10 MED ORDER — ONDANSETRON HCL 4 MG/2ML IJ SOLN: 4.0000 mg | Freq: Four times a day (QID) | INTRAMUSCULAR | Status: DC | PRN

## 2018-01-10 MED ORDER — ONDANSETRON HCL 4 MG/2ML IJ SOLN
INTRAMUSCULAR | Status: DC | PRN
  Administered 2018-01-10: 4 mg via INTRAVENOUS

## 2018-01-10 MED ORDER — METHOCARBAMOL 1000 MG/10ML IJ SOLN
500.0000 mg | Freq: Four times a day (QID) | INTRAVENOUS | Status: DC | PRN
  Filled 2018-01-10: qty 5

## 2018-01-10 MED ORDER — MIDAZOLAM HCL 5 MG/5ML IJ SOLN
INTRAMUSCULAR | Status: DC | PRN
  Administered 2018-01-10: 2 mg via INTRAVENOUS

## 2018-01-10 MED ORDER — METOCLOPRAMIDE HCL 5 MG/ML IJ SOLN: 5.0000 mg | Freq: Three times a day (TID) | INTRAMUSCULAR | Status: DC | PRN

## 2018-01-10 MED ORDER — SODIUM CHLORIDE 0.9 % IV SOLN
INTRAVENOUS | Status: DC
  Administered 2018-01-10: 11:00:00 via INTRAVENOUS

## 2018-01-10 MED ORDER — PROPOFOL 10 MG/ML IV BOLUS
INTRAVENOUS | Status: AC
  Filled 2018-01-10: qty 20

## 2018-01-10 MED ORDER — BISACODYL 10 MG RE SUPP: 10.0000 mg | Freq: Every day | RECTAL | Status: DC | PRN

## 2018-01-10 MED ORDER — ONDANSETRON HCL 4 MG PO TABS
4.0000 mg | ORAL_TABLET | Freq: Four times a day (QID) | ORAL | Status: DC | PRN
Start: 2018-01-10 — End: 2018-01-11

## 2018-01-10 MED ORDER — TRANEXAMIC ACID 1000 MG/10ML IV SOLN
1000.0000 mg | Freq: Once | INTRAVENOUS | Status: AC
  Administered 2018-01-10: 1000 mg via INTRAVENOUS
  Filled 2018-01-10: qty 1100

## 2018-01-10 MED ORDER — CEFAZOLIN SODIUM-DEXTROSE 2-4 GM/100ML-% IV SOLN
2.0000 g | Freq: Four times a day (QID) | INTRAVENOUS | Status: AC
  Administered 2018-01-10 (×2): 2 g via INTRAVENOUS
  Filled 2018-01-10 (×2): qty 100

## 2018-01-10 MED ORDER — DEXAMETHASONE SODIUM PHOSPHATE 10 MG/ML IJ SOLN
10.0000 mg | Freq: Once | INTRAMUSCULAR | Status: AC
  Administered 2018-01-11: 10 mg via INTRAVENOUS
  Filled 2018-01-10: qty 1

## 2018-01-10 MED ORDER — MORPHINE SULFATE (PF) 2 MG/ML IV SOLN: 0.5000 mg | INTRAVENOUS | Status: DC | PRN

## 2018-01-10 MED ORDER — PROMETHAZINE HCL 25 MG/ML IJ SOLN: 6.2500 mg | INTRAMUSCULAR | Status: DC | PRN

## 2018-01-10 MED ORDER — HYDROCODONE-ACETAMINOPHEN 5-325 MG PO TABS
1.0000 | ORAL_TABLET | ORAL | Status: DC | PRN
  Administered 2018-01-10 – 2018-01-11 (×3): 1 via ORAL
  Filled 2018-01-10: qty 2
  Filled 2018-01-10 (×2): qty 1

## 2018-01-10 MED ORDER — FERROUS SULFATE 325 (65 FE) MG PO TABS: 325.0000 mg | ORAL_TABLET | Freq: Three times a day (TID) | ORAL | 3 refills | Status: DC

## 2018-01-10 MED ORDER — MAGNESIUM CITRATE PO SOLN: 1.0000 | Freq: Once | ORAL | Status: DC | PRN

## 2018-01-10 MED ORDER — HYDROCODONE-ACETAMINOPHEN 7.5-325 MG PO TABS: 1.0000 | ORAL_TABLET | ORAL | 0 refills | Status: DC | PRN

## 2018-01-10 MED ORDER — CHLORHEXIDINE GLUCONATE 4 % EX LIQD: 60.0000 mL | Freq: Once | CUTANEOUS | Status: DC

## 2018-01-10 MED ORDER — ASPIRIN 81 MG PO CHEW
81.0000 mg | CHEWABLE_TABLET | Freq: Two times a day (BID) | ORAL | Status: DC
  Administered 2018-01-10 – 2018-01-11 (×2): 81 mg via ORAL
  Filled 2018-01-10 (×2): qty 1

## 2018-01-10 MED ORDER — CEFAZOLIN SODIUM-DEXTROSE 2-4 GM/100ML-% IV SOLN
2.0000 g | INTRAVENOUS | Status: AC
  Administered 2018-01-10: 2 g via INTRAVENOUS
  Filled 2018-01-10: qty 100

## 2018-01-10 MED ORDER — FERROUS SULFATE 325 (65 FE) MG PO TABS
325.0000 mg | ORAL_TABLET | Freq: Three times a day (TID) | ORAL | Status: DC
  Administered 2018-01-11: 325 mg via ORAL
  Filled 2018-01-10: qty 1

## 2018-01-10 MED ORDER — SODIUM CHLORIDE 0.9 % IR SOLN
Status: DC | PRN
  Administered 2018-01-10: 1000 mL

## 2018-01-10 MED ORDER — HYDROCODONE-ACETAMINOPHEN 7.5-325 MG PO TABS: 1.0000 | ORAL_TABLET | ORAL | Status: DC | PRN

## 2018-01-10 MED ORDER — DIPHENHYDRAMINE HCL 12.5 MG/5ML PO ELIX: 12.5000 mg | ORAL_SOLUTION | ORAL | Status: DC | PRN

## 2018-01-10 MED ORDER — POLYETHYLENE GLYCOL 3350 17 G PO PACK: 17.0000 g | PACK | Freq: Two times a day (BID) | ORAL | 0 refills | Status: DC

## 2018-01-10 MED ORDER — PROPOFOL 500 MG/50ML IV EMUL
INTRAVENOUS | Status: DC | PRN
  Administered 2018-01-10: 50 ug/kg/min via INTRAVENOUS

## 2018-01-10 MED ORDER — CELECOXIB 200 MG PO CAPS
200.0000 mg | ORAL_CAPSULE | Freq: Two times a day (BID) | ORAL | Status: DC
  Administered 2018-01-10 – 2018-01-11 (×3): 200 mg via ORAL
  Filled 2018-01-10 (×3): qty 1

## 2018-01-10 MED ORDER — ONDANSETRON HCL 4 MG/2ML IJ SOLN
INTRAMUSCULAR | Status: AC
  Filled 2018-01-10: qty 2

## 2018-01-10 MED ORDER — STERILE WATER FOR IRRIGATION IR SOLN
Status: DC | PRN
  Administered 2018-01-10: 2000 mL

## 2018-01-10 MED ORDER — ATORVASTATIN CALCIUM 20 MG PO TABS: 20.0000 mg | ORAL_TABLET | Freq: Every day | ORAL | Status: DC

## 2018-01-10 MED ORDER — LACTATED RINGERS IV SOLN
INTRAVENOUS | Status: DC
  Administered 2018-01-10: 1000 mL via INTRAVENOUS

## 2018-01-10 MED ORDER — MIDAZOLAM HCL 2 MG/2ML IJ SOLN
INTRAMUSCULAR | Status: AC
  Filled 2018-01-10: qty 2

## 2018-01-10 SURGICAL SUPPLY — 35 items
BAG DECANTER FOR FLEXI CONT (MISCELLANEOUS) IMPLANT
BAG ZIPLOCK 12X15 (MISCELLANEOUS) IMPLANT
BLADE SAG 18X100X1.27 (BLADE) ×3 IMPLANT
CAPT HIP TOTAL 2 ×3 IMPLANT
CLOTH BEACON ORANGE TIMEOUT ST (SAFETY) ×3 IMPLANT
COVER PERINEAL POST (MISCELLANEOUS) ×3 IMPLANT
COVER SURGICAL LIGHT HANDLE (MISCELLANEOUS) ×3 IMPLANT
DERMABOND ADVANCED (GAUZE/BANDAGES/DRESSINGS) ×2
DERMABOND ADVANCED .7 DNX12 (GAUZE/BANDAGES/DRESSINGS) ×1 IMPLANT
DRAPE STERI IOBAN 125X83 (DRAPES) ×3 IMPLANT
DRAPE U-SHAPE 47X51 STRL (DRAPES) ×6 IMPLANT
DRESSING AQUACEL AG SP 3.5X10 (GAUZE/BANDAGES/DRESSINGS) ×1 IMPLANT
DRSG AQUACEL AG SP 3.5X10 (GAUZE/BANDAGES/DRESSINGS) ×3
DURAPREP 26ML APPLICATOR (WOUND CARE) ×3 IMPLANT
ELECT REM PT RETURN 15FT ADLT (MISCELLANEOUS) ×3 IMPLANT
GLOVE BIOGEL M STRL SZ7.5 (GLOVE) IMPLANT
GLOVE BIOGEL PI IND STRL 7.5 (GLOVE) ×1 IMPLANT
GLOVE BIOGEL PI IND STRL 8.5 (GLOVE) ×1 IMPLANT
GLOVE BIOGEL PI INDICATOR 7.5 (GLOVE) ×2
GLOVE BIOGEL PI INDICATOR 8.5 (GLOVE) ×2
GLOVE ECLIPSE 8.0 STRL XLNG CF (GLOVE) ×6 IMPLANT
GLOVE ORTHO TXT STRL SZ7.5 (GLOVE) ×3 IMPLANT
GOWN STRL REUS W/TWL 2XL LVL3 (GOWN DISPOSABLE) ×3 IMPLANT
GOWN STRL REUS W/TWL LRG LVL3 (GOWN DISPOSABLE) ×3 IMPLANT
HOLDER FOLEY CATH W/STRAP (MISCELLANEOUS) ×3 IMPLANT
PACK ANTERIOR HIP CUSTOM (KITS) ×3 IMPLANT
SUT MNCRL AB 4-0 PS2 18 (SUTURE) ×3 IMPLANT
SUT STRATAFIX 0 PDS 27 VIOLET (SUTURE) ×3
SUT VIC AB 1 CT1 36 (SUTURE) ×9 IMPLANT
SUT VIC AB 2-0 CT1 27 (SUTURE) ×4
SUT VIC AB 2-0 CT1 TAPERPNT 27 (SUTURE) ×2 IMPLANT
SUTURE STRATFX 0 PDS 27 VIOLET (SUTURE) ×1 IMPLANT
TRAY FOLEY MTR SLVR 16FR STAT (SET/KITS/TRAYS/PACK) IMPLANT
WATER STERILE IRR 1000ML POUR (IV SOLUTION) ×3 IMPLANT
YANKAUER SUCT BULB TIP 10FT TU (MISCELLANEOUS) IMPLANT

## 2018-01-10 NOTE — Transfer of Care (Signed)
Immediate Anesthesia Transfer of Care Note  Patient: Fred Thornton  Procedure(s) Performed: RIGHT TOTAL HIP ARTHROPLASTY ANTERIOR APPROACH (Right Hip)  Patient Location: PACU  Anesthesia Type:Spinal  Level of Consciousness: sedated and patient cooperative  Airway & Oxygen Therapy: Patient Spontanous Breathing and Patient connected to face mask oxygen  Post-op Assessment: Report given to RN and Post -op Vital signs reviewed and stable  Post vital signs: Reviewed and stable  Last Vitals:  Vitals Value Taken Time  BP 111/75 01/10/2018  9:11 AM  Temp    Pulse 72 01/10/2018  9:13 AM  Resp 12 01/10/2018  9:13 AM  SpO2 97 % 01/10/2018  9:13 AM  Vitals shown include unvalidated device data.  Last Pain:  Vitals:   01/10/18 0617  TempSrc:   PainSc: 0-No pain         Complications: No apparent anesthesia complications

## 2018-01-10 NOTE — Op Note (Signed)
NAME:  Fred Thornton                ACCOUNT NO.: [id number]      MEDICAL RECORD NO.: [id number]      FACILITY:  [facility]      PHYSICIAN:  Mauri Pole  DATE OF BIRTH:  1946-06-01     DATE OF PROCEDURE:  01/10/2018                                 OPERATIVE REPORT         PREOPERATIVE DIAGNOSIS: Right  hip osteoarthritis.      POSTOPERATIVE DIAGNOSIS:  Right hip osteoarthritis.      PROCEDURE:  Right total hip replacement through an anterior approach   utilizing DePuy THR system, component size 25mm pinnacle cup, a size 36+4 neutral   Altrex liner, a size 7 hi Tri Lock stem with a 36+5 delta ceramic   ball.      SURGEON:  Pietro Cassis. Alvan Dame, M.D.      ASSISTANT:  Danae Orleans, PA-C     ANESTHESIA:  Spinal.      SPECIMENS:  None.      COMPLICATIONS:  None.      BLOOD LOSS:  400 cc     DRAINS:  None.      INDICATION OF THE PROCEDURE:  ALIC HILBURN is a 72 y.o. male who had   presented to office for evaluation of right hip pain.  Radiographs revealed   progressive degenerative changes with bone-on-bone   articulation to the  hip joint.  The patient had painful limited range of   motion significantly affecting their overall quality of life.  The patient was failing to    respond to conservative measures, and at this point was ready   to proceed with more definitive measures.  The patient has noted progressive   degenerative changes in his hip, progressive problems and dysfunction   with regarding the hip prior to surgery.  Consent was obtained for   benefit of pain relief.  Specific risk of infection, DVT, component   failure, dislocation, need for revision surgery, as well discussion of   the anterior versus posterior approach were reviewed.  Consent was   obtained for benefit of anterior pain relief through an anterior   approach.      PROCEDURE IN DETAIL:  The patient was brought to operative theater.   Once adequate anesthesia,  preoperative antibiotics, 2 gm of Ancef, 1 gm of Tranexamic Acid, and 10 mg of Decadron administered.   The patient was positioned supine on the OSI Hanna table.  Once adequate   padding of boney process was carried out, we had predraped out the hip, and  used fluoroscopy to confirm orientation of the pelvis and position.      The right hip was then prepped and draped from proximal iliac crest to   mid thigh with shower curtain technique.      Time-out was performed identifying the patient, planned procedure, and   extremity.     An incision was then made 2 cm distal and lateral to the   anterior superior iliac spine extending over the orientation of the   tensor fascia lata muscle and sharp dissection was carried down to the   fascia of the muscle and protractor placed in the soft tissues.      The  fascia was then incised.  The muscle belly was identified and swept   laterally and retractor placed along the superior neck.  Following   cauterization of the circumflex vessels and removing some pericapsular   fat, a second cobra retractor was placed on the inferior neck.  A third   retractor was placed on the anterior acetabulum after elevating the   anterior rectus.  A L-capsulotomy was along the line of the   superior neck to the trochanteric fossa, then extended proximally and   distally.  Tag sutures were placed and the retractors were then placed   intracapsular.  We then identified the trochanteric fossa and   orientation of my neck cut, confirmed this radiographically   and then made a neck osteotomy with the femur on traction.  The femoral   head was removed without difficulty or complication.  Traction was let   off and retractors were placed posterior and anterior around the   acetabulum.      The labrum and foveal tissue were debrided.  I began reaming with a 16mm   reamer and reamed up to 65mm reamer with good bony bed preparation and a 91mm   cup was chosen.  The final  86mm Pinnacle cup was then impacted under fluoroscopy  to confirm the depth of penetration and orientation with respect to   abduction.  A screw was placed followed by the hole eliminator.  The final   36+4 neutral Altrex liner was impacted with good visualized rim fit.  The cup was positioned anatomically within the acetabular portion of the pelvis.      At this point, the femur was rolled at 80 degrees.  Further capsule was   released off the inferior aspect of the femoral neck.  I then   released the superior capsule proximally.  The hook was placed laterally   along the femur and elevated manually and held in position with the bed   hook.  The leg was then extended and adducted with the leg rolled to 100   degrees of external rotation.  Once the proximal femur was fully   exposed, I used a box osteotome to set orientation.  I then began   broaching with the starting chili pepper broach and passed this by hand and then broached up to 7.  With the 7 broach in place I chose a high offset neck and did  several trial reductions.  The offset was appropriate, leg lengths   appeared to be equal best matched to his other hip (with arthritic changes) with the +5 head ball confirmed radiographically.   Given these findings, I went ahead and dislocated the hip, repositioned all   retractors and positioned the right hip in the extended and abducted position.  The final 7 Hi Tri Lock stem was   chosen and it was impacted down to the level of neck cut.  Based on this   and the trial reduction, a 36+5 delta ceramic ball was chosen and   impacted onto a clean and dry trunnion, and the hip was reduced.  The   hip had been irrigated throughout the case again at this point.  I did   reapproximate the superior capsular leaflet to the anterior leaflet   using #1 Vicryl.  The fascia of the   tensor fascia lata muscle was then reapproximated using #1 Vicryl and #0 Stratafix sutures.  The   remaining wound was  closed with 2-0 Vicryl and running  4-0 Monocryl.   The hip was cleaned, dried, and dressed sterilely using Dermabond and   Aquacel dressing.  He was then brought   to recovery room in stable condition tolerating the procedure well.    Danae Orleans, PA-C was present for the entirety of the case involved from   preoperative positioning, perioperative retractor management, general   facilitation of the case, as well as primary wound closure as assistant.            Pietro Cassis Alvan Dame, M.D.        01/10/2018 8:47 AM

## 2018-01-10 NOTE — Interval H&P Note (Signed)
History and Physical Interval Note:  01/10/2018 6:59 AM  Fred Thornton  has presented today for surgery, with the diagnosis of Right hip osteoarthritis  The various methods of treatment have been discussed with the patient and family. After consideration of risks, benefits and other options for treatment, the patient has consented to  Procedure(s) with comments: RIGHT TOTAL HIP ARTHROPLASTY ANTERIOR APPROACH (Right) - 70 mins as a surgical intervention .  The patient's history has been reviewed, patient examined, no change in status, stable for surgery.  I have reviewed the patient's chart and labs.  Questions were answered to the patient's satisfaction.     Mauri Pole

## 2018-01-10 NOTE — Anesthesia Postprocedure Evaluation (Signed)
Anesthesia Post Note  Patient: Fred Thornton  Procedure(s) Performed: RIGHT TOTAL HIP ARTHROPLASTY ANTERIOR APPROACH (Right Hip)     Patient location during evaluation: PACU Anesthesia Type: MAC and Spinal Level of consciousness: awake and alert Pain management: pain level controlled Vital Signs Assessment: post-procedure vital signs reviewed and stable Respiratory status: spontaneous breathing and respiratory function stable Cardiovascular status: blood pressure returned to baseline and stable Postop Assessment: spinal receding Anesthetic complications: no    Last Vitals:  Vitals:   01/10/18 1000 01/10/18 1015  BP: 129/86   Pulse: (!) 55 (!) 57  Resp: 10 14  Temp:    SpO2: 97% 95%    Last Pain:  Vitals:   01/10/18 1000  TempSrc:   PainSc: 0-No pain                 Jovanny Stephanie DANIEL

## 2018-01-10 NOTE — Discharge Instructions (Signed)

## 2018-01-10 NOTE — Plan of Care (Signed)
  Problem: Activity: Goal: Ability to avoid complications of mobility impairment will improve Outcome: Progressing   Problem: Pain Management: Goal: Pain level will decrease with appropriate interventions Outcome: Progressing   

## 2018-01-10 NOTE — Evaluation (Signed)
Physical Therapy Evaluation Patient Details Name: Fred Thornton MRN: [id number] DOB: March 05, 1946 Today's Date: 01/10/2018   History of Present Illness  72 yo male s/p R THA-direct anterior 01/10/18  Clinical Impression  On eval POD 0, pt was Min guard assist for mobility. He walked ~350 feet with a RW. Minimal pain with activity. Per chart, plan is to d/c home with HEP. Will progress activity as tolerated.     Follow Up Recommendations Follow surgeon's recommendation for DC plan and follow-up therapies    Equipment Recommendations  None recommended by PT    Recommendations for Other Services       Precautions / Restrictions Precautions Precautions: Fall Restrictions Weight Bearing Restrictions: No Other Position/Activity Restrictions: WBAT      Mobility  Bed Mobility Overal bed mobility: Needs Assistance Bed Mobility: Supine to Sit;Sit to Supine     Supine to sit: Min guard;HOB elevated Sit to supine: Min guard;HOB elevated   General bed mobility comments: close guard for safety  Transfers Overall transfer level: Needs assistance Equipment used: Rolling walker (2 wheeled) Transfers: Sit to/from Stand Sit to Stand: From elevated surface         General transfer comment: close guard for safety. vcs safety, hand placement   Ambulation/Gait Ambulation/Gait assistance: Min guard Ambulation Distance (Feet): 350 Feet Assistive device: Rolling walker (2 wheeled) Gait Pattern/deviations: Step-through pattern     General Gait Details: close guard for safety. pt has a long stride and moves quickly  Science writer    Modified Rankin (Stroke Patients Only)       Balance Overall balance assessment: Mild deficits observed, not formally tested                                           Pertinent Vitals/Pain Pain Assessment: 0-10 Pain Score: 3  Pain Location: R hip Pain Descriptors / Indicators:  Sore;Discomfort Pain Intervention(s): Monitored during session;Repositioned    Home Living Family/patient expects to be discharged to:: Private residence Living Arrangements: Spouse/significant other   Type of Home: House Home Access: Level entry     Home Layout: One level;Laundry or work area in [place]: Environmental consultant - 2 wheels;Cane - single point;Crutches      Prior Function Level of Independence: Independent               Hand Dominance        Extremity/Trunk Assessment   Upper Extremity Assessment Upper Extremity Assessment: Overall WFL for tasks assessed    Lower Extremity Assessment Lower Extremity Assessment: Generalized weakness(s/p r tha)    Cervical / Trunk Assessment Cervical / Trunk Assessment: Normal  Communication   Communication: No difficulties  Cognition Arousal/Alertness: Awake/alert Behavior During Therapy: WFL for tasks assessed/performed Overall Cognitive Status: Within Functional Limits for tasks assessed                                        General Comments      Exercises     Assessment/Plan    PT Assessment Patient needs continued PT services  PT Problem List Decreased strength;Decreased balance;Decreased mobility;Decreased cognition;Decreased range of motion;Decreased activity tolerance;Decreased knowledge of use of DME       PT Treatment Interventions  DME instruction;Gait training;Functional mobility training;Patient/family education;Balance training;Therapeutic exercise;Stair training;Therapeutic activities    PT Goals (Current goals can be found in the Care Plan section)  Acute Rehab PT Goals Patient Stated Goal[place] PT Goal Formulation: With patient/family Time For Goal Achievement: 01/24/18 Potential to Achieve Goals: Good    Frequency 7X/week   Barriers to discharge        Co-evaluation               AM-PAC PT "6 Clicks" Daily  Activity  Outcome Measure Difficulty turning over in bed (including adjusting bedclothes, sheets and blankets)?: A Little Difficulty moving from lying on back to sitting on the side of the bed? : A Little Difficulty sitting down on and standing up from a chair with arms (e.g., wheelchair, bedside commode, etc,.)?: A Little Help needed moving to and from a bed to chair (including a wheelchair)?: A Little Help needed walking in hospital room?: A Little Help needed climbing 3-5 steps with a railing? : A Little 6 Click Score: 18    End of Session Equipment Utilized During Treatment: Gait belt Activity Tolerance: Patient tolerated treatment well Patient left: in bed;with call bell/phone within reach;with family/visitor present   PT Visit Diagnosis: Pain;Difficulty in walking, not elsewhere classified (R26.2);Other abnormalities of gait and mobility (R26.89) Pain - Right/Left: Right Pain - part of body: Hip    Time: 4665-9935 PT Time Calculation (min) (ACUTE ONLY): 16 min   Charges:   PT Evaluation $PT Eval Low Complexity: 1 Low     PT G Codes:          Weston Anna, MPT Pager: [phone]

## 2018-01-10 NOTE — Anesthesia Procedure Notes (Signed)
Spinal  Patient location during procedure: OR Start time: 01/10/2018 7:22 AM End time: 01/10/2018 7:26 AM Staffing Anesthesiologist: Duane Boston, MD Resident/CRNA: Montel Clock, CRNA Performed: resident/CRNA  Preanesthetic Checklist Completed: patient identified, site marked, surgical consent, pre-op evaluation, timeout performed, IV checked, risks and benefits discussed and monitors and equipment checked Spinal Block Patient position: sitting Prep: DuraPrep Patient monitoring: heart rate, cardiac monitor, continuous pulse ox and blood pressure Approach: midline Location: L3-4 Injection technique: single-shot Needle Needle type: Pencan  Needle gauge: 24 G Needle length: 10 cm Needle insertion depth: 7.5 cm

## 2018-01-11 DIAGNOSIS — E669 Obesity, unspecified: Secondary | ICD-10-CM | POA: Diagnosis present

## 2018-01-11 LAB — CBC
HEMATOCRIT: 41.4 % (ref 39.0–52.0)
HEMOGLOBIN: 14.1 g/dL (ref 13.0–17.0)
MCH: 31.3 pg (ref 26.0–34.0)
MCHC: 34.1 g/dL (ref 30.0–36.0)
MCV: 91.8 fL (ref 78.0–100.0)
Platelets: 164 10*3/uL (ref 150–400)
RBC: 4.51 MIL/uL (ref 4.22–5.81)
RDW: 12.7 % (ref 11.5–15.5)
WBC: 13.8 10*3/uL — ABNORMAL HIGH (ref 4.0–10.5)

## 2018-01-11 LAB — BASIC METABOLIC PANEL
Anion gap: 9 (ref 5–15)
BUN: 17 mg/dL (ref 6–20)
CALCIUM: 8.6 mg/dL — AB (ref 8.9–10.3)
CHLORIDE: 108 mmol/L (ref 101–111)
CO2: 21 mmol/L — AB (ref 22–32)
CREATININE: 1.13 mg/dL (ref 0.61–1.24)
GFR calc Af Amer: >60 mL/min (ref >60)
GFR calc non Af Amer: >60 mL/min (ref >60)
Glucose, Bld: 138 mg/dL — ABNORMAL HIGH (ref 65–99)
Potassium: 4.1 mmol/L (ref 3.5–5.1)
Sodium: 138 mmol/L (ref 135–145)

## 2018-01-11 NOTE — Discharge Summary (Signed)
Physician Discharge Summary  Patient ID: Fred Thornton MRN: [id number] DOB/AGE: Mar 28, 1946 72 y.o.  Admit date: 01/10/2018 Discharge date:  01/11/2018  Procedures:  Procedure(s) (LRB): RIGHT TOTAL HIP ARTHROPLASTY ANTERIOR APPROACH (Right)  Attending Physician:  Dr. Paralee Cancel   Admission Diagnoses:   Right hip primary OA / pain  Discharge Diagnoses:  Principal Problem:   S/P right THA, AA Active Problems:   Obese  Past Medical History:  Diagnosis Date  . Asthma    hx of in childhood   . Hypercholesteremia   . Malignant neoplasm (HCC)    Skin  . Melanoma ([place])    right upper arm  . Microhematuria   . Numbness    right first finger   . Prostate cancer ([place]) 11/15/14  . Urinary straining   . Weak urinary stream     HPI:    Fred Thornton, 73 y.o. male, has a history of pain and functional disability in the right hip(s) due to arthritis and patient has failed non-surgical conservative treatments for greater than 12 weeks to include NSAID's and/or analgesics, corticosteriod injections and activity modification.  Onset of symptoms was gradual starting ~1.5 years ago with gradually worsening course since that time.The patient noted no past surgery on the right hip(s).  Patient currently rates pain in the right hip at 7 out of 10 with activity. Patient has night pain, worsening of pain with activity and weight bearing, trendelenberg gait, pain that interfers with activities of daily living and pain with passive range of motion. Patient has evidence of periarticular osteophytes and joint space narrowing by imaging studies. This condition presents safety issues increasing the risk of falls.  There is no current active infection.  Risks, benefits and expectations were discussed with the patient.  Risks including but not limited to the risk of anesthesia, blood clots, nerve damage, blood vessel damage, failure of the prosthesis, infection and up to and including death.  Patient  understand the risks, benefits and expectations and wishes to proceed with surgery.   PCP: Lawerance Cruel, MD   Discharged Condition: good  Hospital Course:  Patient underwent the above stated procedure on 01/10/2018. Patient tolerated the procedure well and brought to the recovery room in good condition and subsequently to the floor.  POD #1 BP: 114/73 ; Pulse: 69 ; Temp: 98.6 F (37 C) ; Resp: 18 Patient reports pain as mild, pain controlled. No events throughout the night.  Did work with PT yesterday, and feels comfortable.  Ready to be discharged home.  Dorsiflexion/plantar flexion intact, incision: dressing C/D/I, no cellulitis present and compartment soft.   LABS  Basename    HGB     14.1  HCT     41.4    Discharge Exam: General appearance: alert, cooperative and no distress Extremities: Homans sign is negative, no sign of DVT, no edema, redness or tenderness in the calves or thighs and no ulcers, gangrene or trophic changes  Disposition: Home with follow up in 2 weeks   Follow-up Information    Paralee Cancel, MD. Schedule an appointment as soon as possible for a visit in 2 weeks.   Specialty:  Orthopedic Surgery Contact information: [address] [place] [address] [phone]           Discharge Instructions    Call MD / Call 911   Complete by:  As directed    If you experience chest pain or shortness of breath, CALL 911 and be transported  to the hospital emergency room.  If you develope a fever above 101 F, pus (white drainage) or increased drainage or redness at the wound, or calf pain, call your surgeon's office.   Change dressing   Complete by:  As directed    Maintain surgical dressing until follow up in the clinic. If the edges start to pull up, may reinforce with tape. If the dressing is no longer working, may remove and cover with gauze and tape, but must keep the area dry and clean.  Call with any questions or concerns.    Constipation Prevention   Complete by:  As directed    Drink plenty of fluids.  Prune juice may be helpful.  You may use a stool softener, such as Colace (over the counter) 100 mg twice a day.  Use MiraLax (over the counter) for constipation as needed.   Diet - low sodium heart healthy   Complete by:  As directed    Discharge instructions   Complete by:  As directed    Maintain surgical dressing until follow up in the clinic. If the edges start to pull up, may reinforce with tape. If the dressing is no longer working, may remove and cover with gauze and tape, but must keep the area dry and clean.  Follow up in 2 weeks at [facility] Call with any questions or concerns.   Increase activity slowly as tolerated   Complete by:  As directed    Weight bearing as tolerated with assist device (walker, cane, etc) as directed, use it as long as suggested by your surgeon or therapist, typically at least 4-6 weeks.   TED hose   Complete by:  As directed    Use stockings (TED hose) for 2 weeks on both leg(s).  You may remove them at night for sleeping.      Allergies as of 01/11/2018      Reactions   Amoxicillin Rash   Has patient had a PCN reaction causing immediate rash, facial/tongue/throat swelling, SOB or lightheadedness with hypotension: Yes Has patient had a PCN reaction causing severe rash involving mucus membranes or skin necrosis: No Has patient had a PCN reaction that required hospitalization: No Has patient had a PCN reaction occurring within the last 10 years: No If all of the above answers are "NO", then may proceed with Cephalosporin use.      Medication List    TAKE these medications   aspirin 81 MG chewable tablet Commonly known as:  ASPIRIN CHILDRENS Chew 1 tablet (81 mg total) by mouth 2 (two) times daily. Take for 4 weeks, then resume regular dose.   atorvastatin 20 MG tablet Commonly known as:  LIPITOR Take 20 mg by mouth daily.   docusate sodium 100 MG  capsule Commonly known as:  COLACE Take 1 capsule (100 mg total) by mouth 2 (two) times daily.   ferrous sulfate 325 (65 FE) MG tablet Commonly known as:  FERROUSUL Take 1 tablet (325 mg total) by mouth 3 (three) times daily with meals.   HYDROcodone-acetaminophen 7.5-325 MG tablet Commonly known as:  NORCO Take 1-2 tablets by mouth every 4 (four) hours as needed for moderate pain.   methocarbamol 500 MG tablet Commonly known as:  ROBAXIN Take 1 tablet (500 mg total) by mouth every 6 (six) hours as needed for muscle spasms.   polyethylene glycol packet Commonly known as:  MIRALAX / GLYCOLAX Take 17 g by mouth 2 (two) times daily.  Discharge Care Instructions  (From admission, onward)        Start     Ordered   01/11/18 0000  Change dressing    Comments:  Maintain surgical dressing until follow up in the clinic. If the edges start to pull up, may reinforce with tape. If the dressing is no longer working, may remove and cover with gauze and tape, but must keep the area dry and clean.  Call with any questions or concerns.   01/11/18 0845       Signed: West Pugh. Elige Shouse   PA-C  01/11/2018, 8:46 AM

## 2018-01-11 NOTE — Progress Notes (Signed)
     Subjective: 1 Day Post-Op Procedure(s) (LRB): RIGHT TOTAL HIP ARTHROPLASTY ANTERIOR APPROACH (Right)   Patient reports pain as mild, pain controlled. No events throughout the night.  Did work with PT yesterday, and feels comfortable.  Ready to be discharged home.    Patient's anticipated LOS is less than 2 midnights, meeting these requirements: - Lives within 1 hour of care - Has a competent adult at home to recover with post-op recover - NO history of  - Chronic pain requiring opiods  - Diabetes  - Coronary Artery Disease  - Heart failure  - Heart attack  - Stroke  - DVT/VTE  - Cardiac arrhythmia  - Respiratory Failure/COPD  - Renal failure  - Anemia  - Advanced Liver disease       Objective:   VITALS:   Vitals:   01/11/18 0143 01/11/18 0600  BP: 116/78 114/73  Pulse: 73 69  Resp: 17 18  Temp: 98.4 F (36.9 C) 98.6 F (37 C)  SpO2: 95% 95%    Dorsiflexion/Plantar flexion intact Incision: dressing C/D/I No cellulitis present Compartment soft  LABS Recent Labs    01/11/18 0525  HGB 14.1  HCT 41.4  WBC 13.8*  PLT 164    Recent Labs    01/11/18 0525  NA 138  K 4.1  BUN 17  CREATININE 1.13  GLUCOSE 138*     Assessment/Plan: 1 Day Post-Op Procedure(s) (LRB): RIGHT TOTAL HIP ARTHROPLASTY ANTERIOR APPROACH (Right) Foley cath d/c'ed Advance diet Up with therapy D/C IV fluids Discharge home Follow up in 2 weeks at [facility]. Follow up with OLIN,Calina Patrie D in 2 weeks.  Contact information:  [facility] [address], [place] [phone]    Obese (BMI 30-39.9) Estimated body mass index is 30.3 kg/m as calculated from the following:   Height as of this encounter: 6' 1.5" (1.867 m).   Weight as of this encounter: 105.6 kg (232 lb 12.8 oz). Patient also counseled that weight may inhibit the healing process Patient counseled that losing weight will help with  future health issues      West Pugh. Evella Kasal   PAC  01/11/2018, 8:42 AM

## 2018-01-11 NOTE — Progress Notes (Signed)
Physical Therapy Treatment Patient Details Name: Fred Thornton MRN: [id number] DOB: March 27, 1946 Today's Date: 01/11/2018    History of Present Illness 72 yo male s/p R THA-direct anterior 01/10/18    PT Comments    Mobility is progressing very well. Cues for safety during session. Reviewed/practiced exercises and gait training. Pt and wife reported they do not have any steps to climb at their home. Issued HEP for pt to perform 2x/day until he follows up with surgeon. All education completed. Okay to d/c from PT standpoint-made RN aware.     Follow Up Recommendations  Follow surgeon's recommendation for DC plan and follow-up therapies     Equipment Recommendations  None recommended by PT    Recommendations for Other Services       Precautions / Restrictions Precautions Precautions: Fall Restrictions Weight Bearing Restrictions: No Other Position/Activity Restrictions: WBAT    Mobility  Bed Mobility Overal bed mobility: Modified Independent                Transfers Overall transfer level: Needs assistance Equipment used: Rolling walker (2 wheeled) Transfers: Sit to/from Stand Sit to Stand: Supervision         General transfer comment: for safety. VCs hand placement  Ambulation/Gait Ambulation/Gait assistance: Supervision Ambulation Distance (Feet): 350 Feet Assistive device: Rolling walker (2 wheeled)       General Gait Details: for safety. cues for pacing  and safety   Stairs             Wheelchair Mobility    Modified Rankin (Stroke Patients Only)       Balance Overall balance assessment: Mild deficits observed, not formally tested                                          Cognition Arousal/Alertness: Awake/alert Behavior During Therapy: WFL for tasks assessed/performed Overall Cognitive Status: Within Functional Limits for tasks assessed                                        Exercises Total  Joint Exercises Ankle Circles/Pumps: AROM;Both;15 reps;Supine Quad Sets: AROM;Both;15 reps;Supine Heel Slides: AROM;Right;15 reps;Supine Hip ABduction/ADduction: AROM;Right;15 reps;Supine Long Arc Quad: AROM;Right;15 reps;Seated Knee Flexion: AROM;Right;15 reps;Standing Marching in Standing: AROM;Both;15 reps;Standing General Exercises - Lower Extremity Heel Raises: AROM;Both;15 reps;Standing    General Comments        Pertinent Vitals/Pain Pain Assessment: 0-10 Pain Score: 3  Pain Location: R hip Pain Descriptors / Indicators: Sore;Discomfort Pain Intervention(s): Monitored during session;Repositioned;Ice applied    Home Living                      Prior Function            PT Goals (current goals can now be found in the care plan section) Progress towards PT goals: Progressing toward goals    Frequency    7X/week      PT Plan Current plan remains appropriate    Co-evaluation              AM-PAC PT "6 Clicks" Daily Activity  Outcome Measure  Difficulty turning over in bed (including adjusting bedclothes, sheets and blankets)?: None Difficulty moving from lying on back to sitting on the side of the bed? : None Difficulty sitting  down on and standing up from a chair with arms (e.g., wheelchair, bedside commode, etc,.)?: A Little Help needed moving to and from a bed to chair (including a wheelchair)?: A Little Help needed walking in hospital room?: A Little Help needed climbing 3-5 steps with a railing? : A Little 6 Click Score: 20    End of Session Equipment Utilized During Treatment: Gait belt Activity Tolerance: Patient tolerated treatment well Patient left: in chair;with call bell/phone within reach   PT Visit Diagnosis: Pain;Difficulty in walking, not elsewhere classified (R26.2);Other abnormalities of gait and mobility (R26.89) Pain - Right/Left: Right Pain - part of body: Hip     Time: 0100-7121 PT Time Calculation (min) (ACUTE  ONLY): 23 min  Charges:  $Gait Training: 8-22 mins $Therapeutic Exercise: 8-22 mins                    G Codes:         Weston Anna, MPT Pager: [phone]

## 2018-11-16 ENCOUNTER — Other Ambulatory Visit: Payer: Self-pay | Admitting: Otolaryngology

## 2018-11-16 DIAGNOSIS — H903 Sensorineural hearing loss, bilateral: Secondary | ICD-10-CM

## 2018-12-22 ENCOUNTER — Other Ambulatory Visit: Payer: Medicare Other

## 2019-01-30 ENCOUNTER — Other Ambulatory Visit: Payer: Medicare Other

## 2019-02-19 ENCOUNTER — Other Ambulatory Visit: Payer: Self-pay

## 2019-02-19 ENCOUNTER — Ambulatory Visit
Admission: RE | Admit: 2019-02-19 | Discharge: 2019-02-19 | Disposition: A | Discharge status: Home / Self Care | Payer: Medicare Other | Source: Ambulatory Visit | Attending: Otolaryngology | Admitting: Otolaryngology

## 2019-02-19 DIAGNOSIS — H903 Sensorineural hearing loss, bilateral: Secondary | ICD-10-CM

## 2019-02-19 MED ORDER — GADOBENATE DIMEGLUMINE 529 MG/ML IV SOLN
20.0000 mL | Freq: Once | INTRAVENOUS | Status: AC | PRN
Start: 2019-02-19 — End: 2019-02-19
  Administered 2019-02-19: 20 mL via INTRAVENOUS

## 2019-09-21 ENCOUNTER — Ambulatory Visit: Payer: Medicare PPO | Attending: Internal Medicine

## 2019-09-21 DIAGNOSIS — Z23 Encounter for immunization: Secondary | ICD-10-CM | POA: Insufficient documentation

## 2019-09-21 NOTE — Progress Notes (Signed)
   Covid-19 Vaccination Clinic  Name:  Fred Thornton    MRN: [id number] DOB: September 23, 1945  09/21/2019  [name] was observed post Covid-19 immunization for 15 minutes without incidence. He was provided with Vaccine Information Sheet and instruction to access the V-Safe system.   [name] was instructed to call 911 with any severe reactions post vaccine: Marland Kitchen Difficulty breathing  . Swelling of your face and throat  . A fast heartbeat  . A bad rash all over your body  . Dizziness and weakness    Immunizations Administered    Name Date Dose VIS Date Route   Pfizer COVID-19 Vaccine 09/21/2019  9:03 AM 0.3 mL 08/10/2019 Intramuscular   Manufacturer: [place]   Lot: [id number]   [place]: [id number]

## 2019-10-12 ENCOUNTER — Ambulatory Visit: Payer: Medicare PPO | Attending: Internal Medicine

## 2019-10-12 DIAGNOSIS — Z23 Encounter for immunization: Secondary | ICD-10-CM | POA: Insufficient documentation

## 2019-10-12 NOTE — Progress Notes (Signed)
   Covid-19 Vaccination Clinic  Name:  Fred Thornton    MRN: [id number] DOB: 29-Jan-1946  10/12/2019  Fred Thornton was observed post Covid-19 immunization for 15 minutes without incidence. He was provided with Vaccine Information Sheet and instruction to access the V-Safe system.   Fred Thornton was instructed to call 911 with any severe reactions post vaccine: Marland Kitchen Difficulty breathing  . Swelling of your face and throat  . A fast heartbeat  . A bad rash all over your body  . Dizziness and weakness    Immunizations Administered    Name Date Dose VIS Date Route   Pfizer COVID-19 Vaccine 10/12/2019  9:57 AM 0.3 mL 08/10/2019 Intramuscular   Manufacturer: [place]   Lot: [id number]   [place]: [id number]

## 2020-01-29 DIAGNOSIS — Z86010 Personal history of colonic polyps: Secondary | ICD-10-CM | POA: Diagnosis not present

## 2020-01-29 DIAGNOSIS — R748 Abnormal levels of other serum enzymes: Secondary | ICD-10-CM | POA: Diagnosis not present

## 2020-01-29 DIAGNOSIS — K760 Fatty (change of) liver, not elsewhere classified: Secondary | ICD-10-CM | POA: Diagnosis not present

## 2020-02-20 DIAGNOSIS — R748 Abnormal levels of other serum enzymes: Secondary | ICD-10-CM | POA: Diagnosis not present

## 2020-02-25 DIAGNOSIS — Z85828 Personal history of other malignant neoplasm of skin: Secondary | ICD-10-CM | POA: Diagnosis not present

## 2020-02-25 DIAGNOSIS — L570 Actinic keratosis: Secondary | ICD-10-CM | POA: Diagnosis not present

## 2020-02-25 DIAGNOSIS — D225 Melanocytic nevi of trunk: Secondary | ICD-10-CM | POA: Diagnosis not present

## 2020-02-25 DIAGNOSIS — C44619 Basal cell carcinoma of skin of left upper limb, including shoulder: Secondary | ICD-10-CM | POA: Diagnosis not present

## 2020-02-25 DIAGNOSIS — L814 Other melanin hyperpigmentation: Secondary | ICD-10-CM | POA: Diagnosis not present

## 2020-02-25 DIAGNOSIS — L820 Inflamed seborrheic keratosis: Secondary | ICD-10-CM | POA: Diagnosis not present

## 2020-02-25 DIAGNOSIS — L821 Other seborrheic keratosis: Secondary | ICD-10-CM | POA: Diagnosis not present

## 2020-04-08 DIAGNOSIS — R945 Abnormal results of liver function studies: Secondary | ICD-10-CM | POA: Diagnosis not present

## 2020-08-05 DIAGNOSIS — Z8546 Personal history of malignant neoplasm of prostate: Secondary | ICD-10-CM | POA: Diagnosis not present

## 2020-08-06 DIAGNOSIS — R748 Abnormal levels of other serum enzymes: Secondary | ICD-10-CM | POA: Diagnosis not present

## 2020-08-11 DIAGNOSIS — D225 Melanocytic nevi of trunk: Secondary | ICD-10-CM | POA: Diagnosis not present

## 2020-08-11 DIAGNOSIS — C44519 Basal cell carcinoma of skin of other part of trunk: Secondary | ICD-10-CM | POA: Diagnosis not present

## 2020-08-11 DIAGNOSIS — L821 Other seborrheic keratosis: Secondary | ICD-10-CM | POA: Diagnosis not present

## 2020-08-11 DIAGNOSIS — C4441 Basal cell carcinoma of skin of scalp and neck: Secondary | ICD-10-CM | POA: Diagnosis not present

## 2020-08-11 DIAGNOSIS — L570 Actinic keratosis: Secondary | ICD-10-CM | POA: Diagnosis not present

## 2020-08-11 DIAGNOSIS — Z85828 Personal history of other malignant neoplasm of skin: Secondary | ICD-10-CM | POA: Diagnosis not present

## 2020-08-11 DIAGNOSIS — L814 Other melanin hyperpigmentation: Secondary | ICD-10-CM | POA: Diagnosis not present

## 2020-08-12 DIAGNOSIS — Z8546 Personal history of malignant neoplasm of prostate: Secondary | ICD-10-CM | POA: Diagnosis not present

## 2020-08-12 DIAGNOSIS — N5201 Erectile dysfunction due to arterial insufficiency: Secondary | ICD-10-CM | POA: Diagnosis not present

## 2020-10-14 DIAGNOSIS — E669 Obesity, unspecified: Secondary | ICD-10-CM | POA: Diagnosis not present

## 2020-10-14 DIAGNOSIS — Z8546 Personal history of malignant neoplasm of prostate: Secondary | ICD-10-CM | POA: Diagnosis not present

## 2020-10-14 DIAGNOSIS — R748 Abnormal levels of other serum enzymes: Secondary | ICD-10-CM | POA: Diagnosis not present

## 2020-10-14 DIAGNOSIS — Z1211 Encounter for screening for malignant neoplasm of colon: Secondary | ICD-10-CM | POA: Diagnosis not present

## 2020-10-14 DIAGNOSIS — Z0000 Encounter for general adult medical examination without abnormal findings: Secondary | ICD-10-CM | POA: Diagnosis not present

## 2020-10-14 DIAGNOSIS — K760 Fatty (change of) liver, not elsewhere classified: Secondary | ICD-10-CM | POA: Diagnosis not present

## 2020-10-14 DIAGNOSIS — E7800 Pure hypercholesterolemia, unspecified: Secondary | ICD-10-CM | POA: Diagnosis not present

## 2020-10-20 DIAGNOSIS — H31002 Unspecified chorioretinal scars, left eye: Secondary | ICD-10-CM | POA: Diagnosis not present

## 2020-10-20 DIAGNOSIS — H5213 Myopia, bilateral: Secondary | ICD-10-CM | POA: Diagnosis not present

## 2020-10-20 DIAGNOSIS — H524 Presbyopia: Secondary | ICD-10-CM | POA: Diagnosis not present

## 2020-10-20 DIAGNOSIS — H2513 Age-related nuclear cataract, bilateral: Secondary | ICD-10-CM | POA: Diagnosis not present

## 2020-10-21 ENCOUNTER — Other Ambulatory Visit: Payer: Self-pay | Admitting: Gastroenterology

## 2020-10-21 DIAGNOSIS — R7989 Other specified abnormal findings of blood chemistry: Secondary | ICD-10-CM

## 2020-11-10 ENCOUNTER — Ambulatory Visit
Admission: RE | Admit: 2020-11-10 | Discharge: 2020-11-10 | Disposition: A | Discharge status: Home / Self Care | Payer: Medicare PPO | Source: Ambulatory Visit | Attending: Gastroenterology | Admitting: Gastroenterology

## 2020-11-10 DIAGNOSIS — R7989 Other specified abnormal findings of blood chemistry: Secondary | ICD-10-CM

## 2020-11-10 DIAGNOSIS — K8020 Calculus of gallbladder without cholecystitis without obstruction: Secondary | ICD-10-CM | POA: Diagnosis not present

## 2020-11-10 DIAGNOSIS — K7689 Other specified diseases of liver: Secondary | ICD-10-CM | POA: Diagnosis not present

## 2020-11-26 DIAGNOSIS — R748 Abnormal levels of other serum enzymes: Secondary | ICD-10-CM | POA: Diagnosis not present

## 2020-11-28 DIAGNOSIS — R748 Abnormal levels of other serum enzymes: Secondary | ICD-10-CM | POA: Diagnosis not present

## 2020-11-28 DIAGNOSIS — Z86010 Personal history of colonic polyps: Secondary | ICD-10-CM | POA: Diagnosis not present

## 2020-11-28 DIAGNOSIS — K760 Fatty (change of) liver, not elsewhere classified: Secondary | ICD-10-CM | POA: Diagnosis not present

## 2021-01-02 DIAGNOSIS — R748 Abnormal levels of other serum enzymes: Secondary | ICD-10-CM | POA: Diagnosis not present

## 2021-02-09 DIAGNOSIS — L814 Other melanin hyperpigmentation: Secondary | ICD-10-CM | POA: Diagnosis not present

## 2021-02-09 DIAGNOSIS — C44619 Basal cell carcinoma of skin of left upper limb, including shoulder: Secondary | ICD-10-CM | POA: Diagnosis not present

## 2021-02-09 DIAGNOSIS — D1801 Hemangioma of skin and subcutaneous tissue: Secondary | ICD-10-CM | POA: Diagnosis not present

## 2021-02-09 DIAGNOSIS — L570 Actinic keratosis: Secondary | ICD-10-CM | POA: Diagnosis not present

## 2021-02-09 DIAGNOSIS — L821 Other seborrheic keratosis: Secondary | ICD-10-CM | POA: Diagnosis not present

## 2021-02-09 DIAGNOSIS — Z85828 Personal history of other malignant neoplasm of skin: Secondary | ICD-10-CM | POA: Diagnosis not present

## 2021-02-09 DIAGNOSIS — B078 Other viral warts: Secondary | ICD-10-CM | POA: Diagnosis not present

## 2021-03-03 DIAGNOSIS — K621 Rectal polyp: Secondary | ICD-10-CM | POA: Diagnosis not present

## 2021-03-03 DIAGNOSIS — K640 First degree hemorrhoids: Secondary | ICD-10-CM | POA: Diagnosis not present

## 2021-03-03 DIAGNOSIS — D124 Benign neoplasm of descending colon: Secondary | ICD-10-CM | POA: Diagnosis not present

## 2021-03-03 DIAGNOSIS — Z86010 Personal history of colonic polyps: Secondary | ICD-10-CM | POA: Diagnosis not present

## 2021-03-03 DIAGNOSIS — D125 Benign neoplasm of sigmoid colon: Secondary | ICD-10-CM | POA: Diagnosis not present

## 2021-03-06 DIAGNOSIS — D125 Benign neoplasm of sigmoid colon: Secondary | ICD-10-CM | POA: Diagnosis not present

## 2021-03-06 DIAGNOSIS — K621 Rectal polyp: Secondary | ICD-10-CM | POA: Diagnosis not present

## 2021-03-06 DIAGNOSIS — D124 Benign neoplasm of descending colon: Secondary | ICD-10-CM | POA: Diagnosis not present

## 2021-06-02 DIAGNOSIS — R748 Abnormal levels of other serum enzymes: Secondary | ICD-10-CM | POA: Diagnosis not present

## 2021-07-30 DIAGNOSIS — Z96641 Presence of right artificial hip joint: Secondary | ICD-10-CM | POA: Diagnosis not present

## 2021-07-30 DIAGNOSIS — M25552 Pain in left hip: Secondary | ICD-10-CM | POA: Diagnosis not present

## 2021-07-30 DIAGNOSIS — M1612 Unilateral primary osteoarthritis, left hip: Secondary | ICD-10-CM | POA: Diagnosis not present

## 2021-08-05 DIAGNOSIS — Z8546 Personal history of malignant neoplasm of prostate: Secondary | ICD-10-CM | POA: Diagnosis not present

## 2021-08-12 DIAGNOSIS — N5201 Erectile dysfunction due to arterial insufficiency: Secondary | ICD-10-CM | POA: Diagnosis not present

## 2021-08-12 DIAGNOSIS — Z8546 Personal history of malignant neoplasm of prostate: Secondary | ICD-10-CM | POA: Diagnosis not present

## 2021-08-17 DIAGNOSIS — L814 Other melanin hyperpigmentation: Secondary | ICD-10-CM | POA: Diagnosis not present

## 2021-08-17 DIAGNOSIS — Z85828 Personal history of other malignant neoplasm of skin: Secondary | ICD-10-CM | POA: Diagnosis not present

## 2021-08-17 DIAGNOSIS — C44519 Basal cell carcinoma of skin of other part of trunk: Secondary | ICD-10-CM | POA: Diagnosis not present

## 2021-08-17 DIAGNOSIS — L570 Actinic keratosis: Secondary | ICD-10-CM | POA: Diagnosis not present

## 2021-08-17 DIAGNOSIS — L821 Other seborrheic keratosis: Secondary | ICD-10-CM | POA: Diagnosis not present

## 2021-08-17 DIAGNOSIS — D1801 Hemangioma of skin and subcutaneous tissue: Secondary | ICD-10-CM | POA: Diagnosis not present

## 2021-08-20 DIAGNOSIS — Z131 Encounter for screening for diabetes mellitus: Secondary | ICD-10-CM | POA: Diagnosis not present

## 2021-08-20 DIAGNOSIS — E669 Obesity, unspecified: Secondary | ICD-10-CM | POA: Diagnosis not present

## 2021-08-20 DIAGNOSIS — E7800 Pure hypercholesterolemia, unspecified: Secondary | ICD-10-CM | POA: Diagnosis not present

## 2021-08-20 DIAGNOSIS — R748 Abnormal levels of other serum enzymes: Secondary | ICD-10-CM | POA: Diagnosis not present

## 2021-08-20 DIAGNOSIS — Z01818 Encounter for other preprocedural examination: Secondary | ICD-10-CM | POA: Diagnosis not present

## 2021-09-10 ENCOUNTER — Encounter (HOSPITAL_COMMUNITY)
Admission: RE | Admit: 2021-09-10 | Discharge: 2021-09-10 | Disposition: A | Discharge status: Home / Self Care | Payer: Medicare PPO | Source: Ambulatory Visit | Attending: Orthopedic Surgery | Admitting: Orthopedic Surgery

## 2021-09-10 ENCOUNTER — Encounter (HOSPITAL_COMMUNITY): Payer: Self-pay

## 2021-09-10 ENCOUNTER — Other Ambulatory Visit: Payer: Self-pay

## 2021-09-10 DIAGNOSIS — M1612 Unilateral primary osteoarthritis, left hip: Secondary | ICD-10-CM | POA: Insufficient documentation

## 2021-09-10 DIAGNOSIS — Z01818 Encounter for other preprocedural examination: Secondary | ICD-10-CM | POA: Diagnosis not present

## 2021-09-10 HISTORY — DX: Unspecified osteoarthritis, unspecified site: M19.90

## 2021-09-10 LAB — COMPREHENSIVE METABOLIC PANEL
ALT: 49 U/L — ABNORMAL HIGH (ref 0–44)
AST: 40 U/L (ref 15–41)
Albumin: 4.2 g/dL (ref 3.5–5.0)
Alkaline Phosphatase: 59 U/L (ref 38–126)
Anion gap: 8 (ref 5–15)
BUN: 16 mg/dL (ref 8–23)
CO2: 23 mmol/L (ref 22–32)
Calcium: 9.4 mg/dL (ref 8.9–10.3)
Chloride: 108 mmol/L (ref 98–111)
Creatinine, Ser: 1.1 mg/dL (ref 0.61–1.24)
GFR, Estimated: >60 mL/min (ref >60)
Glucose, Bld: 99 mg/dL (ref 70–99)
Potassium: 3.9 mmol/L (ref 3.5–5.1)
Sodium: 139 mmol/L (ref 135–145)
Total Bilirubin: 0.9 mg/dL (ref 0.3–1.2)
Total Protein: 7.2 g/dL (ref 6.5–8.1)

## 2021-09-10 LAB — CBC
HCT: 51.6 % (ref 39.0–52.0)
Hemoglobin: 16.8 g/dL (ref 13.0–17.0)
MCH: 30.5 pg (ref 26.0–34.0)
MCHC: 32.6 g/dL (ref 30.0–36.0)
MCV: 93.8 fL (ref 80.0–100.0)
Platelets: 172 10*3/uL (ref 150–400)
RBC: 5.5 MIL/uL (ref 4.22–5.81)
RDW: 12.9 % (ref 11.5–15.5)
WBC: 6.2 10*3/uL (ref 4.0–10.5)
nRBC: 0 % (ref 0.0–0.2)

## 2021-09-10 LAB — TYPE AND SCREEN
ABO/RH(D): O NEG
Antibody Screen: NEGATIVE

## 2021-09-10 LAB — SURGICAL PCR SCREEN
MRSA, PCR: NEGATIVE
Staphylococcus aureus: NEGATIVE

## 2021-09-10 NOTE — Progress Notes (Addendum)
Anesthesia Review:  PCP: C Melinda Crutch Clearance- by Courneyon 08/20/21.   Cardiologist : none  Chest x-ray : EKG :09/10/21  Echo : Stress test: Cardiac Cath :  Activity level: can do a flight of stairs wtihout difficulty  Sleep Study/ CPAP : none  Fasting Blood Sugar :      / Checks Blood Sugar -- times a day:   Blood Thinner/ Instructions /Last Dose: ASA / Instructions/ Last Dose :   Blood pressure at preo pwas 151/103 in right arm and in left arm was 151/85.  Pt denies any chest pain, shortness of breath, dizziness or headache.  EKG done due to increased blood pressure.   Covid test on 09/18/21 at 0800am.

## 2021-09-10 NOTE — Progress Notes (Signed)
Covid test on 09/18/2021  at  Come thru main entrance to [place].  Have a seat in lobby on right.  Call [phone] and give them your name and let them know you are here for covid testing .                  Your procedure is scheduled on:  09/22/2021.    Report to [facility] Main  Entrance   Report to admitting at   1135AM     Call this number if you have problems the morning of surgery [phone]    REMEMBER: NO  SOLID FOOD CANDY OR GUM AFTER MIDNIGHT. CLEAR LIQUIDS UNTIL    1120am        . NOTHING BY MOUTH EXCEPT CLEAR LIQUIDS UNTIL    1120am . PLEASE FINISH ENSURE DRINK PER SURGEON ORDER  WHICH NEEDS TO BE COMPLETED AT   1120am    .      CLEAR LIQUID DIET   Foods Allowed                                                                    Coffee and tea, regular and decaf                            Fruit ices (not with fruit pulp)                                      Iced Popsicles                                    Carbonated beverages, regular and diet                                    Cranberry, grape and apple juices Sports drinks like Gatorade Lightly seasoned clear broth or consume(fat free) Sugar, honey syrup ___________________________________________________________________      BRUSH YOUR TEETH MORNING OF SURGERY AND RINSE YOUR MOUTH OUT, NO CHEWING GUM CANDY OR MINTS.     Take these medicines the morning of surgery with A SIP OF WATER:  none   DO NOT TAKE ANY DIABETIC MEDICATIONS DAY OF YOUR SURGERY                               You may not have any metal on your body including hair pins and              piercings  Do not wear jewelry, make-up, lotions, powders or perfumes, deodorant             Do not wear nail polish on your fingernails.  Do not shave  48 hours prior to surgery.              Men may shave face and neck.   Do not bring valuables to the hospital. [facility] IS NOT  RESPONSIBLE   FOR VALUABLES.  Contacts,  dentures or bridgework may not be worn into surgery.  Leave suitcase in the car. After surgery it may be brought to your room.     Patients discharged the day of surgery will not be allowed to drive home. IF YOU ARE HAVING SURGERY AND GOING HOME THE SAME DAY, YOU MUST HAVE AN ADULT TO DRIVE YOU HOME AND BE WITH YOU FOR 24 HOURS. YOU MAY GO HOME BY TAXI OR UBER OR ORTHERWISE, BUT AN ADULT MUST ACCOMPANY YOU HOME AND STAY WITH YOU FOR 24 HOURS.  Name and phone number of your driver:  Special Instructions: N/A              Please read over the following fact sheets you were given: _____________________________________________________________________  [facility] - Preparing for Surgery Before surgery, you can play an important role.  Because skin is not sterile, your skin needs to be as free of germs as possible.  You can reduce the number of germs on your skin by washing with CHG (chlorahexidine gluconate) soap before surgery.  CHG is an antiseptic cleaner which kills germs and bonds with the skin to continue killing germs even after washing. Please DO NOT use if you have an allergy to CHG or antibacterial soaps.  If your skin becomes reddened/irritated stop using the CHG and inform your nurse when you arrive at Short Stay. Do not shave (including legs and underarms) for at least 48 hours prior to the first CHG shower.  You may shave your face/neck. Please follow these instructions carefully:  1.  Shower with CHG Soap the night before surgery and the  morning of Surgery.  2.  If you choose to wash your hair, wash your hair first as usual with your  normal  shampoo.  3.  After you shampoo, rinse your hair and body thoroughly to remove the  shampoo.                           4.  Use CHG as you would any other liquid soap.  You can apply chg directly  to the skin and wash                       Gently with a scrungie or clean washcloth.  5.  Apply the CHG Soap to your body ONLY FROM THE NECK DOWN.   Do  not use on face/ open                           Wound or open sores. Avoid contact with eyes, ears mouth and genitals (private parts).                       Wash face,  Genitals (private parts) with your normal soap.             6.  Wash thoroughly, paying special attention to the area where your surgery  will be performed.  7.  Thoroughly rinse your body with warm water from the neck down.  8.  DO NOT shower/wash with your normal soap after using and rinsing off  the CHG Soap.                9.  Pat yourself dry with a clean towel.            10.  Wear clean pajamas.            11.  Place clean sheets on your bed the night of your first shower and do not  sleep with pets. Day of Surgery : Do not apply any lotions/deodorants the morning of surgery.  Please wear clean clothes to the hospital/surgery center.  FAILURE TO FOLLOW THESE INSTRUCTIONS MAY RESULT IN THE CANCELLATION OF YOUR SURGERY PATIENT SIGNATURE_________________________________  NURSE SIGNATURE__________________________________  ________________________________________________________________________

## 2021-09-18 ENCOUNTER — Other Ambulatory Visit: Payer: Self-pay

## 2021-09-18 ENCOUNTER — Encounter (HOSPITAL_COMMUNITY)
Admission: RE | Admit: 2021-09-18 | Discharge: 2021-09-18 | Disposition: A | Discharge status: Home / Self Care | Payer: Medicare PPO | Source: Ambulatory Visit | Attending: Orthopedic Surgery | Admitting: Orthopedic Surgery

## 2021-09-18 DIAGNOSIS — Z01818 Encounter for other preprocedural examination: Secondary | ICD-10-CM

## 2021-09-18 DIAGNOSIS — Z01812 Encounter for preprocedural laboratory examination: Secondary | ICD-10-CM | POA: Insufficient documentation

## 2021-09-18 DIAGNOSIS — Z20822 Contact with and (suspected) exposure to covid-19: Secondary | ICD-10-CM | POA: Diagnosis not present

## 2021-09-18 LAB — SARS CORONAVIRUS 2 (TAT 6-24 HRS): SARS Coronavirus 2: NEGATIVE

## 2021-09-21 NOTE — H&P (Signed)
TOTAL HIP ADMISSION H&P  Patient is admitted for left total hip arthroplasty.  Subjective:  Chief Complaint: left hip pain  HPI: Fred Thornton, 76 y.o. male, has a history of pain and functional disability in the left hip(s) due to arthritis and patient has failed non-surgical conservative treatments for greater than 12 weeks to include NSAID's and/or analgesics and activity modification.  Onset of symptoms was gradual starting 2 years ago with gradually worsening course since that time.The patient noted no past surgery on the left hip(s).  Patient currently rates pain in the left hip at 7 out of 10 with activity. Patient has worsening of pain with activity and weight bearing, pain that interfers with activities of daily living, and pain with passive range of motion. Patient has evidence of joint space narrowing by imaging studies. This condition presents safety issues increasing the risk of falls.  There is no current active infection.  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   Prostate cancer ([place]) 03/20/2015   Malignant neoplasm of prostate ([place]) 01/02/2015   Past Medical History:  Diagnosis Date   Arthritis    Asthma    hx of in childhood    Hypercholesteremia    Malignant neoplasm ([place])    Skin   Melanoma ([place])    right upper arm   Microhematuria    Numbness    right first finger    Prostate cancer ([place]) 11/15/2014   Urinary straining    Weak urinary stream     Past Surgical History:  Procedure Laterality Date   hx of dermatological surgery     hx of Metacarpal Amputation     and Index finger   LYMPHADENECTOMY Bilateral 03/20/2015   Procedure: BILATERAL PELVIC LYMPHADENECTOMY;  Surgeon: Raynelle Bring, MD;  Location: [facility];  Service: Urology;  Laterality: Bilateral;   PROSTATE BIOPSY  11/15/14   ROBOT ASSISTED LAPAROSCOPIC RADICAL PROSTATECTOMY N/A 03/20/2015   Procedure: ROBOTIC ASSISTED LAPAROSCOPIC RADICAL  PROSTATECTOMY (LEVEL 2);  Surgeon: Raynelle Bring, MD;  Location: [facility];  Service: Urology;  Laterality: N/A;   TOTAL HIP ARTHROPLASTY Right 01/10/2018   Procedure: RIGHT TOTAL HIP ARTHROPLASTY ANTERIOR APPROACH;  Surgeon: Paralee Cancel, MD;  Location: [facility];  Service: Orthopedics;  Laterality: Right;  70 mins    No current facility-administered medications for this encounter.   Current Outpatient Medications  Medication Sig Dispense Refill Last Dose   rosuvastatin (CRESTOR) 5 MG tablet Take 5 mg by mouth daily.      docusate sodium (COLACE) 100 MG capsule Take 1 capsule (100 mg total) by mouth 2 (two) times daily. (Patient not taking: Reported on 09/01/2021) 10 capsule 0 Not Taking   ferrous sulfate (FERROUSUL) 325 (65 FE) MG tablet Take 1 tablet (325 mg total) by mouth 3 (three) times daily with meals. (Patient not taking: Reported on 09/01/2021)  3 Not Taking   HYDROcodone-acetaminophen (NORCO) 7.5-325 MG tablet Take 1-2 tablets by mouth every 4 (four) hours as needed for moderate pain. (Patient not taking: Reported on 09/01/2021) 60 tablet 0 Not Taking   methocarbamol (ROBAXIN) 500 MG tablet Take 1 tablet (500 mg total) by mouth every 6 (six) hours as needed for muscle spasms. (Patient not taking: Reported on 09/01/2021) 40 tablet 0 Not Taking   polyethylene glycol (MIRALAX / GLYCOLAX) packet Take 17 g by mouth 2 (two) times daily. (Patient not taking: Reported on 09/01/2021) 14 each 0 Not Taking  Allergies  Allergen Reactions   Multihance [Gadobenate] Nausea And Vomiting    Within a minute of receiving MRI contrast the patient projectile vomited for several minutes//he had just eaten lunch prior to scan.     Amoxicillin Rash    Has patient had a PCN reaction causing immediate rash, facial/tongue/throat swelling, SOB or lightheadedness with hypotension: Yes Has patient had a PCN reaction causing severe rash involving mucus membranes or skin necrosis: No Has patient had a PCN reaction that required  hospitalization: No Has patient had a PCN reaction occurring within the last 10 years: No If all of the above answers are "NO", then may proceed with Cephalosporin use.     Social History   Tobacco Use   Smoking status: Never   Smokeless tobacco: Never  Substance Use Topics   Alcohol use: Yes    Comment: glass of wine occas    Family History  Problem Relation Age of Onset   Heart block Father    Breast cancer Mother      Review of Systems  Constitutional:  Negative for chills and fever.  Respiratory:  Negative for cough and shortness of breath.   Cardiovascular:  Negative for chest pain.  Gastrointestinal:  Negative for nausea and vomiting.  Musculoskeletal:  Positive for arthralgias.    Objective:  Physical Exam Well nourished and well developed. General: Alert and oriented x3, cooperative and pleasant, no acute distress. Head: normocephalic, atraumatic, neck supple. Eyes: EOMI.  Musculoskeletal: Left hip exam: Painful and limited hip flexion internal rotation to just 5 degrees with pelvic tilting with external rotation to 20 degrees Neurovascular intact distally without lower extremity edema  Calves soft and nontender. Motor function intact in LE. Strength 5/5 LE bilaterally. Neuro: Distal pulses 2+. Sensation to light touch intact in LE.  Vital signs in last 24 hours:    Labs:   Estimated body mass index is 30.08 kg/m as calculated from the following:   Height as of 09/10/21: 6\' 1"  (1.854 m).   Weight as of 09/10/21: 103.4 kg.   Imaging Review Plain radiographs demonstrate severe degenerative joint disease of the left hip(s). The bone quality appears to be adequate for age and reported activity level.      Assessment/Plan:  End stage arthritis, left hip(s)  The patient history, physical examination, clinical judgement of the provider and imaging studies are consistent with end stage degenerative joint disease of the left hip(s) and total hip  arthroplasty is deemed medically necessary. The treatment options including medical management, injection therapy, arthroscopy and arthroplasty were discussed at length. The risks and benefits of total hip arthroplasty were presented and reviewed. The risks due to aseptic loosening, infection, stiffness, dislocation/subluxation,  thromboembolic complications and other imponderables were discussed.  The patient acknowledged the explanation, agreed to proceed with the plan and consent was signed. Patient is being admitted for inpatient treatment for surgery, pain control, PT, OT, prophylactic antibiotics, VTE prophylaxis, progressive ambulation and ADL's and discharge planning.The patient is planning to be discharged  home   Therapy Plans: HEP Disposition: Home with wife Planned DVT Prophylaxis: aspirin 81mg  BID DME needed: walker PCP: Marda Stalker, PA-C, clearance received TXA: IV Allergies: amoxicillin - rash (~50 years ago) Anesthesia Concerns: none BMI: 29.9 Last HgbA1c: Not diabetic  Other: - Hydrocodone (#20), robaxin, tylenol, celebrex  Costella Hatcher, PA-C Orthopedic Surgery [facility] [place] [phone]

## 2021-09-22 ENCOUNTER — Ambulatory Visit (HOSPITAL_COMMUNITY): Payer: Medicare PPO | Admitting: Anesthesiology

## 2021-09-22 ENCOUNTER — Ambulatory Visit (HOSPITAL_COMMUNITY): Payer: Medicare PPO | Admitting: Physician Assistant

## 2021-09-22 ENCOUNTER — Ambulatory Visit (HOSPITAL_COMMUNITY): Payer: Medicare PPO

## 2021-09-22 ENCOUNTER — Observation Stay (HOSPITAL_COMMUNITY)
Admission: RE | Admit: 2021-09-22 | Discharge: 2021-09-23 | Disposition: A | Discharge status: Home / Self Care | Payer: Medicare PPO | Attending: Orthopedic Surgery | Admitting: Orthopedic Surgery

## 2021-09-22 ENCOUNTER — Other Ambulatory Visit: Payer: Self-pay

## 2021-09-22 ENCOUNTER — Encounter (HOSPITAL_COMMUNITY): Payer: Self-pay | Admitting: Orthopedic Surgery

## 2021-09-22 ENCOUNTER — Encounter (HOSPITAL_COMMUNITY)
Admission: RE | Disposition: A | Discharge status: Home / Self Care | Payer: Self-pay | Source: Home / Self Care | Attending: Orthopedic Surgery

## 2021-09-22 DIAGNOSIS — J45909 Unspecified asthma, uncomplicated: Secondary | ICD-10-CM | POA: Diagnosis not present

## 2021-09-22 DIAGNOSIS — Z96649 Presence of unspecified artificial hip joint: Secondary | ICD-10-CM

## 2021-09-22 DIAGNOSIS — Z79899 Other long term (current) drug therapy: Secondary | ICD-10-CM | POA: Insufficient documentation

## 2021-09-22 DIAGNOSIS — Z01818 Encounter for other preprocedural examination: Secondary | ICD-10-CM

## 2021-09-22 DIAGNOSIS — M25752 Osteophyte, left hip: Secondary | ICD-10-CM | POA: Diagnosis not present

## 2021-09-22 DIAGNOSIS — Z8546 Personal history of malignant neoplasm of prostate: Secondary | ICD-10-CM | POA: Diagnosis not present

## 2021-09-22 DIAGNOSIS — M1612 Unilateral primary osteoarthritis, left hip: Secondary | ICD-10-CM | POA: Diagnosis not present

## 2021-09-22 DIAGNOSIS — Z85828 Personal history of other malignant neoplasm of skin: Secondary | ICD-10-CM | POA: Insufficient documentation

## 2021-09-22 DIAGNOSIS — Z96642 Presence of left artificial hip joint: Secondary | ICD-10-CM

## 2021-09-22 DIAGNOSIS — Z471 Aftercare following joint replacement surgery: Secondary | ICD-10-CM | POA: Diagnosis not present

## 2021-09-22 DIAGNOSIS — Z96641 Presence of right artificial hip joint: Secondary | ICD-10-CM | POA: Diagnosis not present

## 2021-09-22 DIAGNOSIS — Z419 Encounter for procedure for purposes other than remedying health state, unspecified: Secondary | ICD-10-CM

## 2021-09-22 HISTORY — PX: TOTAL HIP ARTHROPLASTY: SHX124

## 2021-09-22 SURGERY — ARTHROPLASTY, HIP, TOTAL, ANTERIOR APPROACH
Anesthesia: Monitor Anesthesia Care | Site: Hip | Laterality: Left

## 2021-09-22 MED ORDER — METHOCARBAMOL 500 MG PO TABS
500.0000 mg | ORAL_TABLET | Freq: Four times a day (QID) | ORAL | Status: DC | PRN
  Administered 2021-09-22: 20:00:00 500 mg via ORAL
  Filled 2021-09-22: qty 1

## 2021-09-22 MED ORDER — ACETAMINOPHEN 325 MG PO TABS: 325.0000 mg | ORAL_TABLET | Freq: Four times a day (QID) | ORAL | Status: DC | PRN

## 2021-09-22 MED ORDER — ACETAMINOPHEN 160 MG/5ML PO SOLN: 325.0000 mg | ORAL | Status: DC | PRN

## 2021-09-22 MED ORDER — HYDROCODONE-ACETAMINOPHEN 7.5-325 MG PO TABS: 1.0000 | ORAL_TABLET | ORAL | Status: DC | PRN

## 2021-09-22 MED ORDER — FENTANYL CITRATE PF 50 MCG/ML IJ SOSY: 25.0000 ug | PREFILLED_SYRINGE | INTRAMUSCULAR | Status: DC | PRN

## 2021-09-22 MED ORDER — HYDROCODONE-ACETAMINOPHEN 5-325 MG PO TABS
1.0000 | ORAL_TABLET | ORAL | Status: DC | PRN
  Administered 2021-09-22: 20:00:00 1 via ORAL
  Filled 2021-09-22: qty 1

## 2021-09-22 MED ORDER — OXYCODONE HCL 5 MG/5ML PO SOLN: 5.0000 mg | Freq: Once | ORAL | Status: DC | PRN

## 2021-09-22 MED ORDER — POVIDONE-IODINE 10 % EX SWAB
2.0000 "application " | Freq: Once | CUTANEOUS | Status: AC
  Administered 2021-09-22: 2 via TOPICAL

## 2021-09-22 MED ORDER — METOCLOPRAMIDE HCL 5 MG/ML IJ SOLN: 5.0000 mg | Freq: Three times a day (TID) | INTRAMUSCULAR | Status: DC | PRN

## 2021-09-22 MED ORDER — PHENOL 1.4 % MT LIQD: 1.0000 | OROMUCOSAL | Status: DC | PRN

## 2021-09-22 MED ORDER — MENTHOL 3 MG MT LOZG: 1.0000 | LOZENGE | OROMUCOSAL | Status: DC | PRN

## 2021-09-22 MED ORDER — PROPOFOL 1000 MG/100ML IV EMUL
INTRAVENOUS | Status: AC
  Filled 2021-09-22: qty 100

## 2021-09-22 MED ORDER — ONDANSETRON HCL 4 MG/2ML IJ SOLN: 4.0000 mg | Freq: Once | INTRAMUSCULAR | Status: DC | PRN

## 2021-09-22 MED ORDER — EPHEDRINE SULFATE-NACL 50-0.9 MG/10ML-% IV SOSY
PREFILLED_SYRINGE | INTRAVENOUS | Status: DC | PRN
Start: 2021-09-22 — End: 2021-09-22
  Administered 2021-09-22 (×2): 10 mg via INTRAVENOUS

## 2021-09-22 MED ORDER — FENTANYL CITRATE (PF) 100 MCG/2ML IJ SOLN
INTRAMUSCULAR | Status: DC | PRN
  Administered 2021-09-22: 50 ug via INTRAVENOUS

## 2021-09-22 MED ORDER — OXYCODONE HCL 5 MG PO TABS: 5.0000 mg | ORAL_TABLET | Freq: Once | ORAL | Status: DC | PRN

## 2021-09-22 MED ORDER — ONDANSETRON HCL 4 MG/2ML IJ SOLN
INTRAMUSCULAR | Status: AC
  Filled 2021-09-22: qty 2

## 2021-09-22 MED ORDER — STERILE WATER FOR IRRIGATION IR SOLN
Status: DC | PRN
  Administered 2021-09-22: 2000 mL

## 2021-09-22 MED ORDER — SODIUM CHLORIDE 0.9 % IV SOLN: INTRAVENOUS | Status: DC

## 2021-09-22 MED ORDER — ONDANSETRON HCL 4 MG/2ML IJ SOLN: 4.0000 mg | Freq: Four times a day (QID) | INTRAMUSCULAR | Status: DC | PRN

## 2021-09-22 MED ORDER — CEFAZOLIN SODIUM-DEXTROSE 2-4 GM/100ML-% IV SOLN
2.0000 g | Freq: Four times a day (QID) | INTRAVENOUS | Status: AC
  Administered 2021-09-22 – 2021-09-23 (×2): 2 g via INTRAVENOUS
  Filled 2021-09-22 (×2): qty 100

## 2021-09-22 MED ORDER — PHENYLEPHRINE 40 MCG/ML (10ML) SYRINGE FOR IV PUSH (FOR BLOOD PRESSURE SUPPORT)
PREFILLED_SYRINGE | INTRAVENOUS | Status: DC | PRN
  Administered 2021-09-22 (×4): 80 ug via INTRAVENOUS
  Administered 2021-09-22 (×2): 120 ug via INTRAVENOUS

## 2021-09-22 MED ORDER — DIPHENHYDRAMINE HCL 12.5 MG/5ML PO ELIX
12.5000 mg | ORAL_SOLUTION | ORAL | Status: DC | PRN
  Administered 2021-09-23: 02:00:00 25 mg via ORAL
  Filled 2021-09-22: qty 10

## 2021-09-22 MED ORDER — CEFAZOLIN SODIUM-DEXTROSE 2-4 GM/100ML-% IV SOLN
2.0000 g | INTRAVENOUS | Status: AC
  Administered 2021-09-22: 14:00:00 2 g via INTRAVENOUS
  Filled 2021-09-22: qty 100

## 2021-09-22 MED ORDER — DEXAMETHASONE SODIUM PHOSPHATE 10 MG/ML IJ SOLN
10.0000 mg | Freq: Once | INTRAMUSCULAR | Status: AC
  Administered 2021-09-23: 11:00:00 10 mg via INTRAVENOUS
  Filled 2021-09-22: qty 1

## 2021-09-22 MED ORDER — BUPIVACAINE HCL (PF) 0.75 % IJ SOLN
INTRAMUSCULAR | Status: DC | PRN
  Administered 2021-09-22: 2 mL

## 2021-09-22 MED ORDER — ROSUVASTATIN CALCIUM 5 MG PO TABS
5.0000 mg | ORAL_TABLET | Freq: Every day | ORAL | Status: DC
  Administered 2021-09-23: 11:00:00 5 mg via ORAL
  Filled 2021-09-22: qty 1

## 2021-09-22 MED ORDER — MIDAZOLAM HCL 5 MG/5ML IJ SOLN
INTRAMUSCULAR | Status: DC | PRN
  Administered 2021-09-22: 2 mg via INTRAVENOUS

## 2021-09-22 MED ORDER — MORPHINE SULFATE (PF) 2 MG/ML IV SOLN: 0.5000 mg | INTRAVENOUS | Status: DC | PRN

## 2021-09-22 MED ORDER — MIDAZOLAM HCL 2 MG/2ML IJ SOLN
INTRAMUSCULAR | Status: AC
  Filled 2021-09-22: qty 2

## 2021-09-22 MED ORDER — PROPOFOL 500 MG/50ML IV EMUL
INTRAVENOUS | Status: DC | PRN
  Administered 2021-09-22: 130 ug/kg/min via INTRAVENOUS

## 2021-09-22 MED ORDER — ASPIRIN 81 MG PO CHEW
81.0000 mg | CHEWABLE_TABLET | Freq: Two times a day (BID) | ORAL | Status: DC
  Administered 2021-09-23: 11:00:00 81 mg via ORAL
  Filled 2021-09-22: qty 1

## 2021-09-22 MED ORDER — TRANEXAMIC ACID-NACL 1000-0.7 MG/100ML-% IV SOLN
1000.0000 mg | INTRAVENOUS | Status: AC
  Administered 2021-09-22: 14:00:00 1000 mg via INTRAVENOUS
  Filled 2021-09-22: qty 100

## 2021-09-22 MED ORDER — TRANEXAMIC ACID-NACL 1000-0.7 MG/100ML-% IV SOLN
1000.0000 mg | Freq: Once | INTRAVENOUS | Status: AC
  Administered 2021-09-22: 19:00:00 1000 mg via INTRAVENOUS
  Filled 2021-09-22: qty 100

## 2021-09-22 MED ORDER — ONDANSETRON HCL 4 MG/2ML IJ SOLN
INTRAMUSCULAR | Status: DC | PRN
Start: 2021-09-22 — End: 2021-09-22
  Administered 2021-09-22: 4 mg via INTRAVENOUS

## 2021-09-22 MED ORDER — ONDANSETRON HCL 4 MG PO TABS: 4.0000 mg | ORAL_TABLET | Freq: Four times a day (QID) | ORAL | Status: DC | PRN

## 2021-09-22 MED ORDER — FERROUS SULFATE 325 (65 FE) MG PO TABS: 325.0000 mg | ORAL_TABLET | Freq: Three times a day (TID) | ORAL | Status: DC

## 2021-09-22 MED ORDER — METOCLOPRAMIDE HCL 5 MG PO TABS: 5.0000 mg | ORAL_TABLET | Freq: Three times a day (TID) | ORAL | Status: DC | PRN

## 2021-09-22 MED ORDER — FENTANYL CITRATE (PF) 100 MCG/2ML IJ SOLN
INTRAMUSCULAR | Status: AC
  Filled 2021-09-22: qty 2

## 2021-09-22 MED ORDER — MEPERIDINE HCL 50 MG/ML IJ SOLN: 6.2500 mg | INTRAMUSCULAR | Status: DC | PRN

## 2021-09-22 MED ORDER — POLYETHYLENE GLYCOL 3350 17 G PO PACK: 17.0000 g | PACK | Freq: Every day | ORAL | Status: DC | PRN

## 2021-09-22 MED ORDER — DEXAMETHASONE SODIUM PHOSPHATE 10 MG/ML IJ SOLN
INTRAMUSCULAR | Status: AC
  Filled 2021-09-22: qty 1

## 2021-09-22 MED ORDER — DOCUSATE SODIUM 100 MG PO CAPS
100.0000 mg | ORAL_CAPSULE | Freq: Two times a day (BID) | ORAL | Status: DC
  Administered 2021-09-22 – 2021-09-23 (×2): 100 mg via ORAL
  Filled 2021-09-22 (×2): qty 1

## 2021-09-22 MED ORDER — LACTATED RINGERS IV SOLN: INTRAVENOUS | Status: DC

## 2021-09-22 MED ORDER — BISACODYL 10 MG RE SUPP: 10.0000 mg | Freq: Every day | RECTAL | Status: DC | PRN

## 2021-09-22 MED ORDER — DEXAMETHASONE SODIUM PHOSPHATE 10 MG/ML IJ SOLN
8.0000 mg | Freq: Once | INTRAMUSCULAR | Status: AC
  Administered 2021-09-22: 14:00:00 8 mg via INTRAVENOUS

## 2021-09-22 MED ORDER — SODIUM CHLORIDE 0.9 % IR SOLN
Status: DC | PRN
  Administered 2021-09-22: 1000 mL

## 2021-09-22 MED ORDER — METHOCARBAMOL 1000 MG/10ML IJ SOLN
500.0000 mg | Freq: Four times a day (QID) | INTRAVENOUS | Status: DC | PRN
  Filled 2021-09-22: qty 5

## 2021-09-22 MED ORDER — CELECOXIB 200 MG PO CAPS
200.0000 mg | ORAL_CAPSULE | Freq: Two times a day (BID) | ORAL | Status: DC
  Administered 2021-09-22 – 2021-09-23 (×2): 200 mg via ORAL
  Filled 2021-09-22 (×2): qty 1

## 2021-09-22 MED ORDER — ACETAMINOPHEN 325 MG PO TABS: 325.0000 mg | ORAL_TABLET | ORAL | Status: DC | PRN

## 2021-09-22 SURGICAL SUPPLY — 41 items
BAG COUNTER SPONGE SURGICOUNT (BAG) IMPLANT
BAG DECANTER FOR FLEXI CONT (MISCELLANEOUS) IMPLANT
BAG SURGICOUNT SPONGE COUNTING (BAG)
BAG ZIPLOCK 12X15 (MISCELLANEOUS) IMPLANT
BLADE SAG 18X100X1.27 (BLADE) ×3 IMPLANT
COVER PERINEAL POST (MISCELLANEOUS) ×3 IMPLANT
COVER SURGICAL LIGHT HANDLE (MISCELLANEOUS) ×3 IMPLANT
CUP ACET PINNACLE SECTR 56MM (Hips) IMPLANT
DERMABOND ADVANCED (GAUZE/BANDAGES/DRESSINGS) ×2
DERMABOND ADVANCED .7 DNX12 (GAUZE/BANDAGES/DRESSINGS) ×1 IMPLANT
DRAPE FOOT SWITCH (DRAPES) ×3 IMPLANT
DRAPE STERI IOBAN 125X83 (DRAPES) ×3 IMPLANT
DRAPE U-SHAPE 47X51 STRL (DRAPES) ×6 IMPLANT
DRESSING AQUACEL AG SP 3.5X10 (GAUZE/BANDAGES/DRESSINGS) ×1 IMPLANT
DRSG AQUACEL AG SP 3.5X10 (GAUZE/BANDAGES/DRESSINGS) ×3
DURAPREP 26ML APPLICATOR (WOUND CARE) ×3 IMPLANT
ELECT REM PT RETURN 15FT ADLT (MISCELLANEOUS) ×3 IMPLANT
ELIMINATOR HOLE APEX DEPUY (Hips) ×2 IMPLANT
GLOVE SURG ENC MOIS LTX SZ6 (GLOVE) ×3 IMPLANT
GLOVE SURG ENC MOIS LTX SZ7 (GLOVE) ×3 IMPLANT
GLOVE SURG UNDER LTX SZ6.5 (GLOVE) ×3 IMPLANT
GLOVE SURG UNDER POLY LF SZ7.5 (GLOVE) ×3 IMPLANT
GOWN STRL REUS W/TWL LRG LVL3 (GOWN DISPOSABLE) ×6 IMPLANT
HEAD CERAMIC DELTA 36 PLUS 1.5 (Hips) ×2 IMPLANT
HOLDER FOLEY CATH W/STRAP (MISCELLANEOUS) ×3 IMPLANT
KIT TURNOVER KIT A (KITS) IMPLANT
PACK ANTERIOR HIP CUSTOM (KITS) ×3 IMPLANT
PINNACLE ALTRX PLUS 4 N 36X56 (Hips) ×2 IMPLANT
PINNACLE SECTOR CUP 56MM (Hips) ×3 IMPLANT
SCREW 6.5MMX30MM (Screw) ×2 IMPLANT
SPONGE T-LAP 18X18 ~~LOC~~+RFID (SPONGE) ×9 IMPLANT
STEM FEM ACTIS HIGH SZ7 (Stem) ×2 IMPLANT
SUT MNCRL AB 4-0 PS2 18 (SUTURE) ×3 IMPLANT
SUT STRATAFIX 0 PDS 27 VIOLET (SUTURE) ×3
SUT VIC AB 1 CT1 36 (SUTURE) ×9 IMPLANT
SUT VIC AB 2-0 CT1 27 (SUTURE) ×4
SUT VIC AB 2-0 CT1 TAPERPNT 27 (SUTURE) ×2 IMPLANT
SUTURE STRATFX 0 PDS 27 VIOLET (SUTURE) ×1 IMPLANT
TRAY FOLEY MTR SLVR 16FR STAT (SET/KITS/TRAYS/PACK) IMPLANT
TUBE SUCTION HIGH CAP CLEAR NV (SUCTIONS) ×3 IMPLANT
WATER STERILE IRR 1000ML POUR (IV SOLUTION) ×3 IMPLANT

## 2021-09-22 NOTE — Care Plan (Signed)
Ortho Bundle Case Management Note  Patient Details  Name: ONIE HAYASHI MRN: [id number] Date of Birth: 1945/09/19                  L THA on 09-22-21 DCP: Home with wife DME: RW ordered through [place] [facility] PT: HEP   DME Arranged:  [place] Rile rolling DME Agency:  [employer]  HH Arranged:    [place] Agency:     Additional Comments: Please contact me with any questions of if this plan should need to change.  Marianne Sofia, RN,CCM [facility]  [phone] 09/22/2021, 12:14 PM

## 2021-09-22 NOTE — Discharge Instructions (Signed)

## 2021-09-22 NOTE — Anesthesia Procedure Notes (Signed)
Spinal  Start time: 09/22/2021 1:51 PM End time: 09/22/2021 1:54 PM Reason for block: surgical anesthesia Staffing Performed: resident/CRNA  Anesthesiologist: Janeece Riggers, MD Resident/CRNA: Lind Covert, CRNA Preanesthetic Checklist Completed: patient identified, IV checked, site marked, risks and benefits discussed, surgical consent, monitors and equipment checked, pre-op evaluation and timeout performed Spinal Block Patient position: sitting Prep: DuraPrep Patient monitoring: heart rate, cardiac monitor, continuous pulse ox and blood pressure Approach: midline Location: L3-4 Injection technique: single-shot Needle Needle type: Pencan  Needle gauge: 24 G Needle length: 10 cm Needle insertion depth: 7 cm Assessment Sensory level: T4 Additional Notes Timeout performed. Patient in sitting position. L3-4 identified. SAB without difficulty. Patient to supine position.

## 2021-09-22 NOTE — Anesthesia Preprocedure Evaluation (Signed)
Anesthesia Evaluation  Patient identified by MRN, date of birth, ID band Patient awake    Reviewed: Allergy & Precautions, NPO status , Patient's Chart, lab work & pertinent test results  History of Anesthesia Complications Negative for: history of anesthetic complications  Airway Mallampati: III  TM Distance: >3 FB Neck ROM: Full    Dental no notable dental hx. (+) Dental Advisory Given, Teeth Intact   Pulmonary asthma ,    Pulmonary exam normal        Cardiovascular negative cardio ROS Normal cardiovascular exam     Neuro/Psych negative neurological ROS  negative psych ROS   GI/Hepatic negative GI ROS, Neg liver ROS,   Endo/Other  negative endocrine ROS  Renal/GU negative Renal ROS  negative genitourinary   Musculoskeletal  (+) Arthritis , Osteoarthritis,    Abdominal   Peds negative pediatric ROS (+)  Hematology negative hematology ROS (+)   Anesthesia Other Findings Prostate Ca  Reproductive/Obstetrics negative OB ROS                             Anesthesia Physical  Anesthesia Plan  ASA: 2  Anesthesia Plan: Spinal and MAC   Post-op Pain Management:    Induction:   PONV Risk Score and Plan: 2 and Ondansetron and Propofol infusion  Airway Management Planned: Natural Airway  Additional Equipment:   Intra-op Plan:   Post-operative Plan:   Informed Consent: I have reviewed the patients History and Physical, chart, labs and discussed the procedure including the risks, benefits and alternatives for the proposed anesthesia with the patient or authorized representative who has indicated his/her understanding and acceptance.     Dental advisory given  Plan Discussed with: CRNA and Anesthesiologist  Anesthesia Plan Comments:         Anesthesia Quick Evaluation

## 2021-09-22 NOTE — Op Note (Signed)
NAME:  Fred Thornton                ACCOUNT NO.: [id number]      MEDICAL RECORD NO.: [id number]      FACILITY:  [facility]      PHYSICIAN:  Mauri Pole  DATE OF BIRTH:  [birth year]     DATE OF PROCEDURE:  09/22/2021                                 OPERATIVE REPORT         PREOPERATIVE DIAGNOSIS: Left  hip osteoarthritis.      POSTOPERATIVE DIAGNOSIS:  Left hip osteoarthritis.      PROCEDURE:  Left total hip replacement through an anterior approach   utilizing DePuy THR system, component size 56 mm pinnacle cup, a size 36+4 neutral   Altrex liner, a size 7 Hi Actis stem with a 36+1.5 delta ceramic   ball.      SURGEON:  Pietro Cassis. Alvan Dame, M.D.      ASSISTANT:  Costella Hatcher, PA-C     ANESTHESIA:  Spinal.      SPECIMENS:  None.      COMPLICATIONS:  None.      BLOOD LOSS:  500 cc     DRAINS:  None.      INDICATION OF THE PROCEDURE:  Fred Thornton is a 76 y.o. male who had   presented to office for evaluation of left hip pain.  Radiographs revealed   progressive degenerative changes with bone-on-bone   articulation of the  hip joint, including subchondral cystic changes and osteophytes.  The patient had painful limited range of   motion significantly affecting their overall quality of life and function.  The patient was failing to    respond to conservative measures including medications and/or injections and activity modification and at this point was ready   to proceed with more definitive measures.  Consent was obtained for   benefit of pain relief.  Specific risks of infection, DVT, component   failure, dislocation, neurovascular injury, and need for revision surgery were reviewed in the office as well discussion of   the anterior versus posterior approach were reviewed.     PROCEDURE IN DETAIL:  The patient was brought to operative theater.   Once adequate anesthesia, preoperative antibiotics, 2 gm of Ancef, 1 gm of Tranexamic Acid, and 10  mg of Decadron were administered, the patient was positioned supine on the [employer] table.  Once the patient was safely positioned with adequate padding of boney prominences we predraped out the hip, and used fluoroscopy to confirm orientation of the pelvis.      The left hip was then prepped and draped from proximal iliac crest to   mid thigh with a shower curtain technique.      Time-out was performed identifying the patient, planned procedure, and the appropriate extremity.     An incision was then made 2 cm lateral to the   anterior superior iliac spine extending over the orientation of the   tensor fascia lata muscle and sharp dissection was carried down to the   fascia of the muscle.      The fascia was then incised.  The muscle belly was identified and swept   laterally and retractor placed along the superior neck.  Following   cauterization of the circumflex vessels and removing some  pericapsular   fat, a second cobra retractor was placed on the inferior neck.  A T-capsulotomy was made along the line of the   superior neck to the trochanteric fossa, then extended proximally and   distally.  Tag sutures were placed and the retractors were then placed   intracapsular.  We then identified the trochanteric fossa and   orientation of my neck cut and then made a neck osteotomy with the femur on traction.  The femoral   head was removed without difficulty or complication.  Traction was let   off and retractors were placed posterior and anterior around the   acetabulum.      The labrum and foveal tissue were debrided.  I began reaming with a 51 mm   reamer and reamed up to 55 mm reamer with good bony bed preparation and a 56 mm  cup was chosen.  The final 56 mm Pinnacle cup was then impacted under fluoroscopy to confirm the depth of penetration and orientation with respect to   Abduction and forward flexion.  A screw was placed into the ilium followed by the hole eliminator.  The final    36+4 neutral Altrex liner was impacted with good visualized rim fit.  The cup was positioned anatomically within the acetabular portion of the pelvis.      At this point, the femur was rolled to 100 degrees.  Further capsule was   released off the inferior aspect of the femoral neck.  I then   released the superior capsule proximally.  With the leg in a neutral position the hook was placed laterally   along the femur under the vastus lateralis origin and elevated manually and then held in position using the hook attachment on the bed.  The leg was then extended and adducted with the leg rolled to 100   degrees of external rotation.  Retractors were placed along the medial calcar and posteriorly over the greater trochanter.  Once the proximal femur was fully   exposed, I used a box osteotome to set orientation.  I then began   broaching with the starting chili pepper broach and passed this by hand and then broached up to 7.  With the 7 broach in place I chose a high offset neck and did several trial reductions.  The offset was appropriate, leg lengths   appeared to be equal best matched with the +1.5 head ball trial confirmed radiographically.   Given these findings, I went ahead and dislocated the hip, repositioned all   retractors and positioned the right hip in the extended and abducted position.  The final 7 Hi Actis stem was   chosen and it was impacted down to the level of neck cut.  Based on this   and the trial reductions, a final 36+1.5 delta ceramic ball was chosen and   impacted onto a clean and dry trunnion, and the hip was reduced.  The   hip had been irrigated throughout the case again at this point.  I did   reapproximate the superior capsular leaflet to the anterior leaflet   using #1 Vicryl.  The fascia of the   tensor fascia lata muscle was then reapproximated using #1 Vicryl and #0 Stratafix sutures.  The   remaining wound was closed with 2-0 Vicryl and running 4-0 Monocryl.    The hip was cleaned, dried, and dressed sterilely using Dermabond and   Aquacel dressing.  The patient was then brought   to  recovery room in stable condition tolerating the procedure well.    Costella Hatcher, PA-C was present for the entirety of the case involved from   preoperative positioning, perioperative retractor management, general   facilitation of the case, as well as primary wound closure as assistant.            Pietro Cassis Alvan Dame, M.D.        09/22/2021 1:52 PM

## 2021-09-22 NOTE — Interval H&P Note (Signed)
History and Physical Interval Note:  09/22/2021 12:06 PM  Fred Thornton  has presented today for surgery, with the diagnosis of Left hip osteoarthritis.  The various methods of treatment have been discussed with the patient and family. After consideration of risks, benefits and other options for treatment, the patient has consented to  Procedure(s): TOTAL HIP ARTHROPLASTY ANTERIOR APPROACH (Left) as a surgical intervention.  The patient's history has been reviewed, patient examined, no change in status, stable for surgery.  I have reviewed the patient's chart and labs.  Questions were answered to the patient's satisfaction.     Mauri Pole

## 2021-09-22 NOTE — Transfer of Care (Signed)
Immediate Anesthesia Transfer of Care Note  Patient: Fred Thornton  Procedure(s) Performed: TOTAL HIP ARTHROPLASTY ANTERIOR APPROACH (Left: Hip)  Patient Location: PACU  Anesthesia Type:Spinal  Level of Consciousness: sedated  Airway & Oxygen Therapy: Patient Spontanous Breathing and Patient connected to face mask oxygen  Post-op Assessment: Report given to RN and Post -op Vital signs reviewed and stable  Post vital signs: Reviewed and stable  Last Vitals:  Vitals Value Taken Time  BP 92/59 09/22/21 1530  Temp    Pulse 82 09/22/21 1532  Resp 13 09/22/21 1532  SpO2 96 % 09/22/21 1532  Vitals shown include unvalidated device data.  Last Pain:  Vitals:   09/22/21 1215  TempSrc: Oral  PainSc:       Patients Stated Pain Goal: 4 (78/93/81 0175)  Complications: No notable events documented.

## 2021-09-22 NOTE — Anesthesia Postprocedure Evaluation (Signed)
Anesthesia Post Note  Patient: KAYMAN SNUFFER  Procedure(s) Performed: TOTAL HIP ARTHROPLASTY ANTERIOR APPROACH (Left: Hip)     Patient location during evaluation: PACU Anesthesia Type: MAC and Spinal Level of consciousness: awake and alert Pain management: pain level controlled Vital Signs Assessment: post-procedure vital signs reviewed and stable Respiratory status: spontaneous breathing, nonlabored ventilation, respiratory function stable and patient connected to nasal cannula oxygen Cardiovascular status: stable and blood pressure returned to baseline Postop Assessment: no apparent nausea or vomiting Anesthetic complications: no   No notable events documented.  Last Vitals:  Vitals:   09/22/21 1215  BP: (!) 148/95  Pulse: (!) 55  Resp: 16  Temp: 36.7 C  SpO2: 93%    Last Pain:  Vitals:   09/22/21 1215  TempSrc: Oral  PainSc:                  Shaya Reddick

## 2021-09-23 ENCOUNTER — Encounter (HOSPITAL_COMMUNITY): Payer: Self-pay | Admitting: Orthopedic Surgery

## 2021-09-23 DIAGNOSIS — Z8546 Personal history of malignant neoplasm of prostate: Secondary | ICD-10-CM | POA: Diagnosis not present

## 2021-09-23 DIAGNOSIS — Z85828 Personal history of other malignant neoplasm of skin: Secondary | ICD-10-CM | POA: Diagnosis not present

## 2021-09-23 DIAGNOSIS — Z96641 Presence of right artificial hip joint: Secondary | ICD-10-CM | POA: Diagnosis not present

## 2021-09-23 DIAGNOSIS — J45909 Unspecified asthma, uncomplicated: Secondary | ICD-10-CM | POA: Diagnosis not present

## 2021-09-23 DIAGNOSIS — Z79899 Other long term (current) drug therapy: Secondary | ICD-10-CM | POA: Diagnosis not present

## 2021-09-23 DIAGNOSIS — M1612 Unilateral primary osteoarthritis, left hip: Secondary | ICD-10-CM | POA: Diagnosis not present

## 2021-09-23 LAB — BASIC METABOLIC PANEL
Anion gap: 8 (ref 5–15)
BUN: 19 mg/dL (ref 8–23)
CO2: 22 mmol/L (ref 22–32)
Calcium: 8.6 mg/dL — ABNORMAL LOW (ref 8.9–10.3)
Chloride: 106 mmol/L (ref 98–111)
Creatinine, Ser: 1.11 mg/dL (ref 0.61–1.24)
GFR, Estimated: >60 mL/min (ref >60)
Glucose, Bld: 172 mg/dL — ABNORMAL HIGH (ref 70–99)
Potassium: 4 mmol/L (ref 3.5–5.1)
Sodium: 136 mmol/L (ref 135–145)

## 2021-09-23 LAB — CBC
HCT: 43.3 % (ref 39.0–52.0)
Hemoglobin: 14.6 g/dL (ref 13.0–17.0)
MCH: 31.1 pg (ref 26.0–34.0)
MCHC: 33.7 g/dL (ref 30.0–36.0)
MCV: 92.1 fL (ref 80.0–100.0)
Platelets: 151 10*3/uL (ref 150–400)
RBC: 4.7 MIL/uL (ref 4.22–5.81)
RDW: 12.7 % (ref 11.5–15.5)
WBC: 12.4 10*3/uL — ABNORMAL HIGH (ref 4.0–10.5)
nRBC: 0 % (ref 0.0–0.2)

## 2021-09-23 MED ORDER — POLYETHYLENE GLYCOL 3350 17 G PO PACK: 17.0000 g | PACK | Freq: Every day | ORAL | 0 refills | Status: DC | PRN

## 2021-09-23 MED ORDER — ASPIRIN 81 MG PO CHEW: 81.0000 mg | CHEWABLE_TABLET | Freq: Two times a day (BID) | ORAL | 0 refills | Status: AC

## 2021-09-23 MED ORDER — CHLORHEXIDINE GLUCONATE CLOTH 2 % EX PADS: 6.0000 | MEDICATED_PAD | Freq: Every day | CUTANEOUS | Status: DC

## 2021-09-23 MED ORDER — METHOCARBAMOL 500 MG PO TABS: 500.0000 mg | ORAL_TABLET | Freq: Four times a day (QID) | ORAL | 0 refills | Status: DC | PRN

## 2021-09-23 MED ORDER — CELECOXIB 200 MG PO CAPS: 200.0000 mg | ORAL_CAPSULE | Freq: Two times a day (BID) | ORAL | 0 refills | Status: DC

## 2021-09-23 MED ORDER — HYDROCODONE-ACETAMINOPHEN 5-325 MG PO TABS: 1.0000 | ORAL_TABLET | Freq: Four times a day (QID) | ORAL | 0 refills | Status: DC | PRN

## 2021-09-23 NOTE — Evaluation (Signed)
Physical Therapy Evaluation Patient Details Name: Fred Thornton MRN: [id number] DOB: 1946-03-06 Today's Date: 09/23/2021  History of Present Illness  Pt is a 76 year old male s/p Lt THA direct anterior approach on 09/22/21.  Clinical Impression  Pt is s/p THA resulting in the deficits listed below (see PT Problem List). Pt will benefit from skilled PT to increase their independence and safety with mobility to allow discharge to the venue listed below.  Pt ambulated in hallway and plans to d/c home later today.        Recommendations for follow up therapy are one component of a multi-disciplinary discharge planning process, led by the attending physician.  Recommendations may be updated based on patient status, additional functional criteria and insurance authorization.  Follow Up Recommendations Follow physician's recommendations for discharge plan and follow up therapies    Assistance Recommended at Discharge    Patient can return home with the following       Equipment Recommendations None recommended by PT  Recommendations for Other Services       Functional Status Assessment Patient has had a recent decline in their functional status and demonstrates the ability to make significant improvements in function in a reasonable and predictable amount of time.     Precautions / Restrictions Precautions Precautions: Fall Restrictions Weight Bearing Restrictions: No LLE Weight Bearing: Weight bearing as tolerated      Mobility  Bed Mobility Overal bed mobility: Needs Assistance Bed Mobility: Supine to Sit     Supine to sit: Min guard, HOB elevated          Transfers Overall transfer level: Needs assistance Equipment used: Rolling walker (2 wheels) Transfers: Sit to/from Stand Sit to Stand: Min guard           General transfer comment: verbal cues for hand placement    Ambulation/Gait Ambulation/Gait assistance: Min guard Gait Distance (Feet): 160  Feet Assistive device: Rolling walker (2 wheels) Gait Pattern/deviations: Step-through pattern, Decreased stride length, Decreased stance time - left       General Gait Details: verbal cues for sequence, RW positioning, step length, posture  Stairs            Wheelchair Mobility    Modified Rankin (Stroke Patients Only)       Balance                                             Pertinent Vitals/Pain Pain Assessment Pain Assessment: 0-10 Pain Score: 6  Pain Location: left hip with mobility (reports none at rest) Pain Descriptors / Indicators: Aching, Sore Pain Intervention(s): Repositioned, Monitored during session    Home Living Family/patient expects to be discharged to:: Private residence Living Arrangements: Spouse/significant other   Type of Home: House Home Access: Level entry       Home Layout: One level Home Equipment: Conservation officer, nature (2 wheels);Cane - single point      Prior Function Prior Level of Function : Independent/Modified Independent                     Hand Dominance        Extremity/Trunk Assessment        Lower Extremity Assessment Lower Extremity Assessment: LLE deficits/detail LLE Deficits / Details: anticipated post op hip weakness, grossly 2+/5 hip strength       Communication  Communication: No difficulties  Cognition Arousal/Alertness: Awake/alert Behavior During Therapy: WFL for tasks assessed/performed Overall Cognitive Status: Within Functional Limits for tasks assessed                                          General Comments      Exercises     Assessment/Plan    PT Assessment Patient needs continued PT services  PT Problem List Decreased strength;Decreased range of motion;Decreased mobility;Decreased knowledge of use of DME;Pain       PT Treatment Interventions Stair training;Gait training;DME instruction;Therapeutic exercise;Balance training;Functional  mobility training;Therapeutic activities;Patient/family education    PT Goals (Current goals can be found in the Care Plan section)  Acute Rehab PT Goals PT Goal Formulation: With patient Time For Goal Achievement: 09/30/21 Potential to Achieve Goals: Good    Frequency 7X/week     Co-evaluation               AM-PAC PT "6 Clicks" Mobility  Outcome Measure Help needed turning from your back to your side while in a flat bed without using bedrails?: A Little Help needed moving from lying on your back to sitting on the side of a flat bed without using bedrails?: A Little Help needed moving to and from a bed to a chair (including a wheelchair)?: A Little Help needed standing up from a chair using your arms (e.g., wheelchair or bedside chair)?: A Little Help needed to walk in hospital room?: A Little Help needed climbing 3-5 steps with a railing? : A Lot 6 Click Score: 17    End of Session Equipment Utilized During Treatment: Gait belt Activity Tolerance: Patient tolerated treatment well Patient left: in chair;with call bell/phone within reach;with chair alarm set Nurse Communication: Mobility status PT Visit Diagnosis: Other abnormalities of gait and mobility (R26.89)    Time: 6144-3154 PT Time Calculation (min) (ACUTE ONLY): 13 min   Charges:   PT Evaluation $PT Eval Low Complexity: 1 Low         Kati PT, DPT Acute Rehabilitation Services Pager: [phone] Office: [place] 09/23/2021, 12:57 PM

## 2021-09-23 NOTE — TOC Transition Note (Addendum)
Transition of Care [facility]) - CM/SW Discharge Note  Patient Details  Name: Fred Thornton MRN: [id number] Date of Birth: [birth year]  [facility] CM/SW Contact:  Sherie Don, LCSW Phone Number: 09/23/2021, 10:27 AM  Clinical Narrative: Patient is expected to discharge home after working with PT. CSW met with patient to confirm discharge plan and needs. Patient will discharge home with a home exercise program (HEP). Patient will need a rolling walker. [employer] delivered walker to patient's room. TOC signing off.  Addendum: Patient has a rolling walker and home and is returning the walker provided by [employer].  Final next level of care: Home/Self Care Barriers to Discharge: No Barriers Identified  Patient Goals and CMS Choice Patient states their goals for this hospitalization and ongoing recovery are:: Discharge home with HEP CMS Medicare.gov Compare Post Acute Care list provided to:: Patient Choice offered to / list presented to : Patient  Discharge Plan and Services        DME Arranged: Walker rolling DME Agency: [employer] Representative spoke with at DME Agency: Prearranged in orthopedist's office  Readmission Risk Interventions No flowsheet data found.

## 2021-09-23 NOTE — Progress Notes (Signed)
Physical Therapy Treatment Patient Details Name: Fred Thornton MRN: [id number] DOB: April 28, 1946 Today's Date: 09/23/2021   History of Present Illness Pt is a 76 year old male s/p Lt THA direct anterior approach on 09/22/21.    PT Comments    Pt performed LE exercises and provided with HEP handout.  Pt ambulated in hallway again as well.  Spouse present this afternoon and confirms no steps to enter or in home.  Pt feels ready for d/c home today.    Recommendations for follow up therapy are one component of a multi-disciplinary discharge planning process, led by the attending physician.  Recommendations may be updated based on patient status, additional functional criteria and insurance authorization.  Follow Up Recommendations  Follow physician's recommendations for discharge plan and follow up therapies     Assistance Recommended at Discharge    Patient can return home with the following     Equipment Recommendations  None recommended by PT    Recommendations for Other Services       Precautions / Restrictions Precautions Precautions: Fall Restrictions Weight Bearing Restrictions: No LLE Weight Bearing: Weight bearing as tolerated     Mobility  Bed Mobility Overal bed mobility: Needs Assistance Bed Mobility: Supine to Sit     Supine to sit: Min guard, HOB elevated     General bed mobility comments: pt in recliner    Transfers Overall transfer level: Needs assistance Equipment used: Rolling walker (2 wheels) Transfers: Sit to/from Stand Sit to Stand: Min guard, Supervision           General transfer comment: verbal cues for hand placement    Ambulation/Gait Ambulation/Gait assistance: Min guard, Supervision Gait Distance (Feet): 200 Feet Assistive device: Rolling walker (2 wheels) Gait Pattern/deviations: Step-through pattern, Decreased stance time - left, Decreased stride length, Antalgic       General Gait Details: verbal cues for sequence, RW  positioning, step length, posture   Stairs             Wheelchair Mobility    Modified Rankin (Stroke Patients Only)       Balance                                            Cognition Arousal/Alertness: Awake/alert Behavior During Therapy: WFL for tasks assessed/performed Overall Cognitive Status: Within Functional Limits for tasks assessed                                          Exercises Total Joint Exercises Ankle Circles/Pumps: AROM, Both, 10 reps Quad Sets: AROM, Both, 10 reps Heel Slides: AAROM, Left, 10 reps Hip ABduction/ADduction: AAROM, Left, 10 reps, Standing, Supine (assist for supine, AROM for standing) Long Arc Quad: AROM, Left, Seated, 10 reps Knee Flexion: AROM, Left, 10 reps, Standing Marching in Standing: AROM, Left, 10 reps, Standing Standing Hip Extension: AROM, Left, Standing, 10 reps    General Comments        Pertinent Vitals/Pain Pain Assessment Pain Assessment: 0-10 Pain Score: 4  Pain Location: left hip with mobility (reports none at rest) Pain Descriptors / Indicators: Aching, Sore Pain Intervention(s): Monitored during session, Repositioned    Home Living Family/patient expects to be discharged to:: Private residence Living Arrangements: Spouse/significant other   Type of Home:  House Home Access: Level entry       Home Layout: One level Home Equipment: Conservation officer, nature (2 wheels);Cane - single point      Prior Function            PT Goals (current goals can now be found in the care plan section) Acute Rehab PT Goals PT Goal Formulation: With patient Time For Goal Achievement: 09/30/21 Potential to Achieve Goals: Good Progress towards PT goals: Progressing toward goals    Frequency    7X/week      PT Plan Current plan remains appropriate    Co-evaluation              AM-PAC PT "6 Clicks" Mobility   Outcome Measure  Help needed turning from your back to your  side while in a flat bed without using bedrails?: A Little Help needed moving from lying on your back to sitting on the side of a flat bed without using bedrails?: A Little Help needed moving to and from a bed to a chair (including a wheelchair)?: A Little Help needed standing up from a chair using your arms (e.g., wheelchair or bedside chair)?: A Little Help needed to walk in hospital room?: A Little Help needed climbing 3-5 steps with a railing? : A Little 6 Click Score: 18    End of Session Equipment Utilized During Treatment: Gait belt Activity Tolerance: Patient tolerated treatment well Patient left: with call bell/phone within reach;in chair;with family/visitor present Nurse Communication: Mobility status PT Visit Diagnosis: Other abnormalities of gait and mobility (R26.89)     Time: 5397-6734 PT Time Calculation (min) (ACUTE ONLY): 15 min  Charges:  $Therapeutic Exercise: 8-22 mins                    Jannette Spanner PT, DPT Acute Rehabilitation Services Pager: [phone] Office: [place] 09/23/2021, 2:44 PM

## 2021-09-23 NOTE — Progress Notes (Signed)
° °  Subjective: 1 Day Post-Op Procedure(s) (LRB): TOTAL HIP ARTHROPLASTY ANTERIOR APPROACH (Left) Patient reports pain as mild.   Patient seen in rounds for Dr. Alvan Dame. Patient is resting in bed on exam this morning with a book. Foley catheter removed.  We will start therapy today.   Objective: Vital signs in last 24 hours: Temp:  [97.5 F (36.4 C)-98.5 F (36.9 C)] 98.5 F (36.9 C) (01/25 0559) Pulse Rate:  [52-86] 70 (01/25 0559) Resp:  [9-17] 17 (01/25 0559) BP: (92-157)/(59-114) 131/73 (01/25 0559) SpO2:  [93 %-99 %] 95 % (01/25 0559) Weight:  [103.4 kg] 103.4 kg (01/24 1204)  Intake/Output from previous day:  Intake/Output Summary (Last 24 hours) at 09/23/2021 1410 Last data filed at 09/23/2021 0600 Gross per 24 hour  Intake 2970.7 ml  Output 1500 ml  Net 1470.7 ml     Intake/Output this shift: No intake/output data recorded.  Labs: Recent Labs    09/23/21 0326  HGB 14.6   Recent Labs    09/23/21 0326  WBC 12.4*  RBC 4.70  HCT 43.3  PLT 151   Recent Labs    09/23/21 0326  NA 136  K 4.0  CL 106  CO2 22  BUN 19  CREATININE 1.11  GLUCOSE 172*  CALCIUM 8.6*   No results for input(s): LABPT, INR in the last 72 hours.  Exam: General - Patient is Alert and Oriented Extremity - Neurologically intact Sensation intact distally Intact pulses distally Dorsiflexion/Plantar flexion intact Dressing - dressing C/D/I Motor Function - intact, moving foot and toes well on exam.   Past Medical History:  Diagnosis Date   Arthritis    Asthma    hx of in childhood    Hypercholesteremia    Malignant neoplasm ([place])    Skin   Melanoma ([place])    right upper arm   Microhematuria    Numbness    right first finger    Prostate cancer ([place]) 11/15/2014   Urinary straining    Weak urinary stream     Assessment/Plan: 1 Day Post-Op Procedure(s) (LRB): TOTAL HIP ARTHROPLASTY ANTERIOR APPROACH (Left) Principal Problem:   S/P total left hip  arthroplasty  Estimated body mass index is 30.08 kg/m as calculated from the following:   Height as of this encounter: 6\' 1"  (1.854 m).   Weight as of this encounter: 103.4 kg. Advance diet Up with therapy D/C IV fluids  DVT Prophylaxis - aspirin Weight bearing as tolerated.  Plan is to go Home after hospital stay. Plan to get up with PT today. Likely discharge home after 1-2 sessions of therapy if meeting his goals. Follow up in the office in 2 weeks.   Griffith Citron, PA-C Orthopedic Surgery [phone] 09/23/2021, 8:22 AM

## 2021-09-25 NOTE — Discharge Summary (Signed)
Patient ID: Fred Thornton MRN: [id number] DOB/AGE: [birth year] 74 y.o.  Admit date: 09/22/2021 Discharge date: 09/23/2021  Admission Diagnoses:  Left hip osteoarthritis  Discharge Diagnoses:  Principal Problem:   S/P total left hip arthroplasty   Past Medical History:  Diagnosis Date   Arthritis    Asthma    hx of in childhood    Hypercholesteremia    Malignant neoplasm ([place])    Skin   Melanoma ([place])    right upper arm   Microhematuria    Numbness    right first finger    Prostate cancer ([place]) 11/15/2014   Urinary straining    Weak urinary stream     Surgeries: Procedure(s): TOTAL HIP ARTHROPLASTY ANTERIOR APPROACH on 09/22/2021   Consultants:   Discharged Condition: Improved  Hospital Course: Fred Thornton is an 76 y.o. male who was admitted 09/22/2021 for operative treatment ofS/P total left hip arthroplasty. Patient has severe unremitting pain that affects sleep, daily activities, and work/hobbies. After pre-op clearance the patient was taken to the operating room on 09/22/2021 and underwent  Procedure(s): TOTAL HIP ARTHROPLASTY ANTERIOR APPROACH.    Patient was given perioperative antibiotics:  Anti-infectives (From admission, onward)    Start     Dose/Rate Route Frequency Ordered Stop   09/22/21 2000  ceFAZolin (ANCEF) IVPB 2g/100 mL premix        2 g 200 mL/hr over 30 Minutes Intravenous Every 6 hours 09/22/21 1841 09/23/21 0210   09/22/21 1200  ceFAZolin (ANCEF) IVPB 2g/100 mL premix        2 g 200 mL/hr over 30 Minutes Intravenous On call to O.R. 09/22/21 1150 09/22/21 1355        Patient was given sequential compression devices, early ambulation, and chemoprophylaxis to prevent DVT. Patient worked with PT and was meeting their goals regarding safe ambulation and transfers.  Patient benefited maximally from hospital stay and there were no complications.    Recent vital signs: No data found.   Recent laboratory studies:  Recent Labs     09/23/21 0326  WBC 12.4*  HGB 14.6  HCT 43.3  PLT 151  NA 136  K 4.0  CL 106  CO2 22  BUN 19  CREATININE 1.11  GLUCOSE 172*  CALCIUM 8.6*     Discharge Medications:   Allergies as of 09/23/2021       Reactions   Multihance [gadobenate] Nausea And Vomiting   Within a minute of receiving MRI contrast the patient projectile vomited for several minutes//he had just eaten lunch prior to scan.     Amoxicillin Rash   Has patient had a PCN reaction causing immediate rash, facial/tongue/throat swelling, SOB or lightheadedness with hypotension: Yes Has patient had a PCN reaction causing severe rash involving mucus membranes or skin necrosis: No Has patient had a PCN reaction that required hospitalization: No Has patient had a PCN reaction occurring within the last 10 years: No If all of the above answers are "NO", then may proceed with Cephalosporin use.        Medication List     STOP taking these medications    docusate sodium 100 MG capsule Commonly known as: Colace   ferrous sulfate 325 (65 FE) MG tablet Commonly known as: FerrouSul   HYDROcodone-acetaminophen 7.5-325 MG tablet Commonly known as: Norco Replaced by: HYDROcodone-acetaminophen 5-325 MG tablet       TAKE these medications    aspirin 81 MG chewable tablet Chew 1 tablet (81 mg total) by mouth  2 (two) times daily for 28 days.   celecoxib 200 MG capsule Commonly known as: CELEBREX Take 1 capsule (200 mg total) by mouth 2 (two) times daily.   HYDROcodone-acetaminophen 5-325 MG tablet Commonly known as: NORCO/VICODIN Take 1-2 tablets by mouth every 6 (six) hours as needed for moderate pain (pain score 4-6). Replaces: HYDROcodone-acetaminophen 7.5-325 MG tablet   methocarbamol 500 MG tablet Commonly known as: ROBAXIN Take 1 tablet (500 mg total) by mouth every 6 (six) hours as needed for muscle spasms.   polyethylene glycol 17 g packet Commonly known as: MIRALAX / GLYCOLAX Take 17 g by mouth daily  as needed for mild constipation. What changed:  when to take this reasons to take this   rosuvastatin 5 MG tablet Commonly known as: CRESTOR Take 5 mg by mouth daily.               Discharge Care Instructions  (From admission, onward)           Start     Ordered   09/23/21 0000  Change dressing       Comments: Maintain surgical dressing until follow up in the clinic. If the edges start to pull up, may reinforce with tape. If the dressing is no longer working, may remove and cover with gauze and tape, but must keep the area dry and clean.  Call with any questions or concerns.   09/23/21 0831            Diagnostic Studies: DG Pelvis Portable  Result Date: 09/22/2021 CLINICAL DATA:  Status post left hip arthroplasty. EXAM: PORTABLE PELVIS 1-2 VIEWS COMPARISON:  None. FINDINGS: Left hip arthroplasty in expected alignment. No periprosthetic lucency or fracture. Recent postsurgical change includes air and edema in the soft tissues. Prior right hip arthroplasty is in place. IMPRESSION: Left hip arthroplasty without immediate postoperative complication. Electronically Signed   By: Keith Rake M.D.   On: 09/22/2021 16:43   DG C-Arm 1-60 Min-No Report  Result Date: 09/22/2021 Fluoroscopy was utilized by the requesting physician.  No radiographic interpretation.   DG HIP OPERATIVE UNILAT WITH PELVIS LEFT  Result Date: 09/22/2021 CLINICAL DATA:  Left total hip replacement. EXAM: OPERATIVE LEFT HIP (WITH PELVIS IF PERFORMED) TECHNIQUE: Fluoroscopic spot image(s) were submitted for interpretation post-operatively. COMPARISON:  None. FINDINGS: Two fluoroscopic spot views of the pelvis and left hip obtained in the operating room. Left hip arthroplasty in place. Fluoroscopy time 7 seconds. Dose 1.11 mGy. IMPRESSION: Procedural fluoroscopy for left hip arthroplasty. Electronically Signed   By: Keith Rake M.D.   On: 09/22/2021 15:31    Disposition: Discharge disposition:  01-Home or Self Care       Discharge Instructions     Call MD / Call 911   Complete by: As directed    If you experience chest pain or shortness of breath, CALL 911 and be transported to the hospital emergency room.  If you develope a fever above 101 F, pus (white drainage) or increased drainage or redness at the wound, or calf pain, call your surgeon's office.   Change dressing   Complete by: As directed    Maintain surgical dressing until follow up in the clinic. If the edges start to pull up, may reinforce with tape. If the dressing is no longer working, may remove and cover with gauze and tape, but must keep the area dry and clean.  Call with any questions or concerns.   Constipation Prevention   Complete by: As directed  Drink plenty of fluids.  Prune juice may be helpful.  You may use a stool softener, such as Colace (over the counter) 100 mg twice a day.  Use MiraLax (over the counter) for constipation as needed.   Diet - low sodium heart healthy   Complete by: As directed    Increase activity slowly as tolerated   Complete by: As directed    Weight bearing as tolerated with assist device (walker, cane, etc) as directed, use it as long as suggested by your surgeon or therapist, typically at least 4-6 weeks.   Post-operative opioid taper instructions:   Complete by: As directed    POST-OPERATIVE OPIOID TAPER INSTRUCTIONS: It is important to wean off of your opioid medication as soon as possible. If you do not need pain medication after your surgery it is ok to stop day one. Opioids include: Codeine, Hydrocodone(Norco, Vicodin), Oxycodone(Percocet, oxycontin) and hydromorphone amongst others.  Long term and even short term use of opiods can cause: Increased pain response Dependence Constipation Depression Respiratory depression And more.  Withdrawal symptoms can include Flu like symptoms Nausea, vomiting And more Techniques to manage these symptoms Hydrate well Eat  regular healthy meals Stay active Use relaxation techniques(deep breathing, meditating, yoga) Do Not substitute Alcohol to help with tapering If you have been on opioids for less than two weeks and do not have pain than it is ok to stop all together.  Plan to wean off of opioids This plan should start within one week post op of your joint replacement. Maintain the same interval or time between taking each dose and first decrease the dose.  Cut the total daily intake of opioids by one tablet each day Next start to increase the time between doses. The last dose that should be eliminated is the evening dose.      TED hose   Complete by: As directed    Use stockings (TED hose) for 2 weeks on both leg(s).  You may remove them at night for sleeping.        Follow-up Information     Paralee Cancel, MD. Go on 10/07/2021.   Specialty: Orthopedic Surgery Why: You are scheduled for first post op appointment on Wednesday February 8th at 10:00am. Contact information: [address] [place] [address] [phone]                  Signed: Irving Copas 09/25/2021, 8:39 AM

## 2021-10-15 DIAGNOSIS — Z8546 Personal history of malignant neoplasm of prostate: Secondary | ICD-10-CM | POA: Diagnosis not present

## 2021-10-15 DIAGNOSIS — C4491 Basal cell carcinoma of skin, unspecified: Secondary | ICD-10-CM | POA: Diagnosis not present

## 2021-10-15 DIAGNOSIS — K760 Fatty (change of) liver, not elsewhere classified: Secondary | ICD-10-CM | POA: Diagnosis not present

## 2021-10-15 DIAGNOSIS — Z0000 Encounter for general adult medical examination without abnormal findings: Secondary | ICD-10-CM | POA: Diagnosis not present

## 2021-10-15 DIAGNOSIS — Z23 Encounter for immunization: Secondary | ICD-10-CM | POA: Diagnosis not present

## 2021-10-15 DIAGNOSIS — E7800 Pure hypercholesterolemia, unspecified: Secondary | ICD-10-CM | POA: Diagnosis not present

## 2021-10-26 DIAGNOSIS — Z0100 Encounter for examination of eyes and vision without abnormal findings: Secondary | ICD-10-CM | POA: Diagnosis not present

## 2021-10-26 DIAGNOSIS — H52203 Unspecified astigmatism, bilateral: Secondary | ICD-10-CM | POA: Diagnosis not present

## 2021-10-26 DIAGNOSIS — H5213 Myopia, bilateral: Secondary | ICD-10-CM | POA: Diagnosis not present

## 2021-10-26 DIAGNOSIS — H31003 Unspecified chorioretinal scars, bilateral: Secondary | ICD-10-CM | POA: Diagnosis not present

## 2021-11-09 DIAGNOSIS — Z96642 Presence of left artificial hip joint: Secondary | ICD-10-CM | POA: Diagnosis not present

## 2021-11-09 DIAGNOSIS — Z471 Aftercare following joint replacement surgery: Secondary | ICD-10-CM | POA: Diagnosis not present

## 2022-02-15 DIAGNOSIS — L570 Actinic keratosis: Secondary | ICD-10-CM | POA: Diagnosis not present

## 2022-02-15 DIAGNOSIS — Z85828 Personal history of other malignant neoplasm of skin: Secondary | ICD-10-CM | POA: Diagnosis not present

## 2022-02-15 DIAGNOSIS — D0461 Carcinoma in situ of skin of right upper limb, including shoulder: Secondary | ICD-10-CM | POA: Diagnosis not present

## 2022-02-15 DIAGNOSIS — L814 Other melanin hyperpigmentation: Secondary | ICD-10-CM | POA: Diagnosis not present

## 2022-02-15 DIAGNOSIS — D1801 Hemangioma of skin and subcutaneous tissue: Secondary | ICD-10-CM | POA: Diagnosis not present

## 2022-02-15 DIAGNOSIS — L821 Other seborrheic keratosis: Secondary | ICD-10-CM | POA: Diagnosis not present

## 2022-06-24 DIAGNOSIS — R197 Diarrhea, unspecified: Secondary | ICD-10-CM | POA: Diagnosis not present

## 2022-06-24 DIAGNOSIS — R1011 Right upper quadrant pain: Secondary | ICD-10-CM | POA: Diagnosis not present

## 2022-06-24 DIAGNOSIS — R030 Elevated blood-pressure reading, without diagnosis of hypertension: Secondary | ICD-10-CM | POA: Diagnosis not present

## 2022-06-24 DIAGNOSIS — R110 Nausea: Secondary | ICD-10-CM | POA: Diagnosis not present

## 2022-07-06 DIAGNOSIS — R748 Abnormal levels of other serum enzymes: Secondary | ICD-10-CM | POA: Diagnosis not present

## 2022-07-06 DIAGNOSIS — K760 Fatty (change of) liver, not elsewhere classified: Secondary | ICD-10-CM | POA: Diagnosis not present

## 2022-08-17 DIAGNOSIS — C44529 Squamous cell carcinoma of skin of other part of trunk: Secondary | ICD-10-CM | POA: Diagnosis not present

## 2022-08-17 DIAGNOSIS — L814 Other melanin hyperpigmentation: Secondary | ICD-10-CM | POA: Diagnosis not present

## 2022-08-17 DIAGNOSIS — L570 Actinic keratosis: Secondary | ICD-10-CM | POA: Diagnosis not present

## 2022-08-17 DIAGNOSIS — Z85828 Personal history of other malignant neoplasm of skin: Secondary | ICD-10-CM | POA: Diagnosis not present

## 2022-08-17 DIAGNOSIS — L308 Other specified dermatitis: Secondary | ICD-10-CM | POA: Diagnosis not present

## 2022-08-17 DIAGNOSIS — L821 Other seborrheic keratosis: Secondary | ICD-10-CM | POA: Diagnosis not present

## 2022-08-17 DIAGNOSIS — B078 Other viral warts: Secondary | ICD-10-CM | POA: Diagnosis not present

## 2022-09-25 DIAGNOSIS — R1011 Right upper quadrant pain: Secondary | ICD-10-CM | POA: Diagnosis not present

## 2022-09-25 DIAGNOSIS — R1012 Left upper quadrant pain: Secondary | ICD-10-CM | POA: Diagnosis not present

## 2022-09-27 ENCOUNTER — Other Ambulatory Visit: Payer: Self-pay | Admitting: Family Medicine

## 2022-09-27 DIAGNOSIS — Z8719 Personal history of other diseases of the digestive system: Secondary | ICD-10-CM | POA: Diagnosis not present

## 2022-09-27 DIAGNOSIS — R1013 Epigastric pain: Secondary | ICD-10-CM

## 2022-09-27 DIAGNOSIS — Z6829 Body mass index (BMI) 29.0-29.9, adult: Secondary | ICD-10-CM | POA: Diagnosis not present

## 2022-09-27 DIAGNOSIS — N289 Disorder of kidney and ureter, unspecified: Secondary | ICD-10-CM | POA: Diagnosis not present

## 2022-09-28 ENCOUNTER — Ambulatory Visit (HOSPITAL_BASED_OUTPATIENT_CLINIC_OR_DEPARTMENT_OTHER)
Admission: RE | Admit: 2022-09-28 | Discharge: 2022-09-28 | Disposition: A | Discharge status: Home / Self Care | Payer: Medicare PPO | Source: Ambulatory Visit | Attending: Family Medicine | Admitting: Family Medicine

## 2022-09-28 ENCOUNTER — Other Ambulatory Visit (HOSPITAL_BASED_OUTPATIENT_CLINIC_OR_DEPARTMENT_OTHER): Payer: Self-pay | Admitting: Family Medicine

## 2022-09-28 ENCOUNTER — Other Ambulatory Visit (HOSPITAL_COMMUNITY): Payer: Self-pay | Admitting: Family Medicine

## 2022-09-28 DIAGNOSIS — K8020 Calculus of gallbladder without cholecystitis without obstruction: Secondary | ICD-10-CM | POA: Diagnosis not present

## 2022-09-28 DIAGNOSIS — R1013 Epigastric pain: Secondary | ICD-10-CM | POA: Insufficient documentation

## 2022-09-28 DIAGNOSIS — K760 Fatty (change of) liver, not elsewhere classified: Secondary | ICD-10-CM | POA: Diagnosis not present

## 2022-09-29 ENCOUNTER — Emergency Department (HOSPITAL_BASED_OUTPATIENT_CLINIC_OR_DEPARTMENT_OTHER): Payer: Medicare PPO

## 2022-09-29 ENCOUNTER — Other Ambulatory Visit: Payer: Self-pay

## 2022-09-29 ENCOUNTER — Observation Stay (HOSPITAL_BASED_OUTPATIENT_CLINIC_OR_DEPARTMENT_OTHER)
Admission: EM | Admit: 2022-09-29 | Discharge: 2022-10-01 | Disposition: A | Discharge status: Home / Self Care | Payer: Medicare PPO | Attending: General Surgery | Admitting: General Surgery

## 2022-09-29 DIAGNOSIS — R1013 Epigastric pain: Secondary | ICD-10-CM | POA: Diagnosis present

## 2022-09-29 DIAGNOSIS — K8000 Calculus of gallbladder with acute cholecystitis without obstruction: Secondary | ICD-10-CM | POA: Diagnosis not present

## 2022-09-29 DIAGNOSIS — R188 Other ascites: Secondary | ICD-10-CM | POA: Diagnosis not present

## 2022-09-29 DIAGNOSIS — K8020 Calculus of gallbladder without cholecystitis without obstruction: Secondary | ICD-10-CM | POA: Diagnosis not present

## 2022-09-29 DIAGNOSIS — J45909 Unspecified asthma, uncomplicated: Secondary | ICD-10-CM | POA: Diagnosis not present

## 2022-09-29 DIAGNOSIS — K810 Acute cholecystitis: Secondary | ICD-10-CM | POA: Diagnosis present

## 2022-09-29 DIAGNOSIS — Z85820 Personal history of malignant melanoma of skin: Secondary | ICD-10-CM | POA: Insufficient documentation

## 2022-09-29 DIAGNOSIS — Z8546 Personal history of malignant neoplasm of prostate: Secondary | ICD-10-CM | POA: Insufficient documentation

## 2022-09-29 DIAGNOSIS — E876 Hypokalemia: Secondary | ICD-10-CM | POA: Diagnosis not present

## 2022-09-29 DIAGNOSIS — Z96643 Presence of artificial hip joint, bilateral: Secondary | ICD-10-CM | POA: Insufficient documentation

## 2022-09-29 DIAGNOSIS — R1011 Right upper quadrant pain: Secondary | ICD-10-CM | POA: Diagnosis not present

## 2022-09-29 DIAGNOSIS — Z79899 Other long term (current) drug therapy: Secondary | ICD-10-CM | POA: Insufficient documentation

## 2022-09-29 DIAGNOSIS — K760 Fatty (change of) liver, not elsewhere classified: Secondary | ICD-10-CM | POA: Diagnosis not present

## 2022-09-29 LAB — URINALYSIS, ROUTINE W REFLEX MICROSCOPIC
Bilirubin Urine: NEGATIVE
Glucose, UA: NEGATIVE mg/dL
Ketones, ur: NEGATIVE mg/dL
Leukocytes,Ua: NEGATIVE
Nitrite: NEGATIVE
Protein, ur: 30 mg/dL — AB
Specific Gravity, Urine: 1.03 (ref 1.005–1.030)
pH: 5.5 (ref 5.0–8.0)

## 2022-09-29 LAB — CBC WITH DIFFERENTIAL/PLATELET
Abs Immature Granulocytes: 0.02 10*3/uL (ref 0.00–0.07)
Basophils Absolute: 0 10*3/uL (ref 0.0–0.1)
Basophils Relative: 0 %
Eosinophils Absolute: 0.1 10*3/uL (ref 0.0–0.5)
Eosinophils Relative: 1 %
HCT: 49.4 % (ref 39.0–52.0)
Hemoglobin: 17 g/dL (ref 13.0–17.0)
Immature Granulocytes: 0 %
Lymphocytes Relative: 8 %
Lymphs Abs: 0.8 10*3/uL (ref 0.7–4.0)
MCH: 31.1 pg (ref 26.0–34.0)
MCHC: 34.4 g/dL (ref 30.0–36.0)
MCV: 90.3 fL (ref 80.0–100.0)
Monocytes Absolute: 0.6 10*3/uL (ref 0.1–1.0)
Monocytes Relative: 6 %
Neutro Abs: 8.8 10*3/uL — ABNORMAL HIGH (ref 1.7–7.7)
Neutrophils Relative %: 85 %
Platelets: 174 10*3/uL (ref 150–400)
RBC: 5.47 MIL/uL (ref 4.22–5.81)
RDW: 13.2 % (ref 11.5–15.5)
WBC: 10.3 10*3/uL (ref 4.0–10.5)
nRBC: 0 % (ref 0.0–0.2)

## 2022-09-29 LAB — LIPASE, BLOOD: Lipase: 13 U/L (ref 11–51)

## 2022-09-29 LAB — COMPREHENSIVE METABOLIC PANEL
ALT: 18 U/L (ref 0–44)
AST: 12 U/L — ABNORMAL LOW (ref 15–41)
Albumin: 4 g/dL (ref 3.5–5.0)
Alkaline Phosphatase: 63 U/L (ref 38–126)
Anion gap: 13 (ref 5–15)
BUN: 34 mg/dL — ABNORMAL HIGH (ref 8–23)
CO2: 24 mmol/L (ref 22–32)
Calcium: 9.7 mg/dL (ref 8.9–10.3)
Chloride: 103 mmol/L (ref 98–111)
Creatinine, Ser: 1.58 mg/dL — ABNORMAL HIGH (ref 0.61–1.24)
GFR, Estimated: 46 mL/min — ABNORMAL LOW (ref >60)
Glucose, Bld: 147 mg/dL — ABNORMAL HIGH (ref 70–99)
Potassium: 3.4 mmol/L — ABNORMAL LOW (ref 3.5–5.1)
Sodium: 140 mmol/L (ref 135–145)
Total Bilirubin: 1.4 mg/dL — ABNORMAL HIGH (ref 0.3–1.2)
Total Protein: 8.1 g/dL (ref 6.5–8.1)

## 2022-09-29 MED ORDER — METHOCARBAMOL 1000 MG/10ML IJ SOLN: 500.0000 mg | Freq: Three times a day (TID) | INTRAVENOUS | Status: DC | PRN

## 2022-09-29 MED ORDER — METOPROLOL TARTRATE 5 MG/5ML IV SOLN: 5.0000 mg | Freq: Four times a day (QID) | INTRAVENOUS | Status: DC | PRN

## 2022-09-29 MED ORDER — ENOXAPARIN SODIUM 40 MG/0.4ML IJ SOSY
40.0000 mg | PREFILLED_SYRINGE | INTRAMUSCULAR | Status: DC
  Filled 2022-09-29: qty 0.4

## 2022-09-29 MED ORDER — CHLORHEXIDINE GLUCONATE CLOTH 2 % EX PADS: 6.0000 | MEDICATED_PAD | Freq: Once | CUTANEOUS | Status: DC

## 2022-09-29 MED ORDER — SODIUM CHLORIDE 0.9 % IV SOLN: 2.0000 g | INTRAVENOUS | Status: DC

## 2022-09-29 MED ORDER — MORPHINE SULFATE (PF) 4 MG/ML IV SOLN
4.0000 mg | Freq: Once | INTRAVENOUS | Status: AC
  Administered 2022-09-29: 4 mg via INTRAVENOUS
  Filled 2022-09-29: qty 1

## 2022-09-29 MED ORDER — CHLORHEXIDINE GLUCONATE CLOTH 2 % EX PADS
6.0000 | MEDICATED_PAD | Freq: Once | CUTANEOUS | Status: AC
  Administered 2022-09-29: 6 via TOPICAL

## 2022-09-29 MED ORDER — DIPHENHYDRAMINE HCL 12.5 MG/5ML PO ELIX: 12.5000 mg | ORAL_SOLUTION | Freq: Four times a day (QID) | ORAL | Status: DC | PRN

## 2022-09-29 MED ORDER — ONDANSETRON HCL 4 MG/2ML IJ SOLN: 4.0000 mg | Freq: Four times a day (QID) | INTRAMUSCULAR | Status: DC | PRN

## 2022-09-29 MED ORDER — OXYCODONE HCL 5 MG PO TABS
5.0000 mg | ORAL_TABLET | ORAL | Status: DC | PRN
  Administered 2022-09-30: 5 mg via ORAL
  Filled 2022-09-29: qty 1

## 2022-09-29 MED ORDER — ACETAMINOPHEN 650 MG RE SUPP: 650.0000 mg | Freq: Four times a day (QID) | RECTAL | Status: DC | PRN

## 2022-09-29 MED ORDER — SODIUM CHLORIDE 0.9 % IV BOLUS
500.0000 mL | Freq: Once | INTRAVENOUS | Status: AC
  Administered 2022-09-29: 500 mL via INTRAVENOUS

## 2022-09-29 MED ORDER — POLYETHYLENE GLYCOL 3350 17 G PO PACK: 17.0000 g | PACK | Freq: Every day | ORAL | Status: DC | PRN

## 2022-09-29 MED ORDER — PIPERACILLIN-TAZOBACTAM 3.375 G IVPB
3.3750 g | Freq: Three times a day (TID) | INTRAVENOUS | Status: DC
  Administered 2022-09-29 – 2022-10-01 (×6): 3.375 g via INTRAVENOUS
  Filled 2022-09-29 (×7): qty 50

## 2022-09-29 MED ORDER — ACETAMINOPHEN 325 MG PO TABS
650.0000 mg | ORAL_TABLET | Freq: Four times a day (QID) | ORAL | Status: DC | PRN
  Administered 2022-09-30: 650 mg via ORAL
  Filled 2022-09-29: qty 2

## 2022-09-29 MED ORDER — PROCHLORPERAZINE EDISYLATE 10 MG/2ML IJ SOLN: 5.0000 mg | Freq: Four times a day (QID) | INTRAMUSCULAR | Status: DC | PRN

## 2022-09-29 MED ORDER — PANTOPRAZOLE SODIUM 40 MG IV SOLR
40.0000 mg | Freq: Every day | INTRAVENOUS | Status: DC
  Administered 2022-09-29 – 2022-09-30 (×2): 40 mg via INTRAVENOUS
  Filled 2022-09-29 (×2): qty 10

## 2022-09-29 MED ORDER — POTASSIUM CHLORIDE IN NACL 20-0.9 MEQ/L-% IV SOLN
INTRAVENOUS | Status: DC
  Filled 2022-09-29 (×3): qty 1000

## 2022-09-29 MED ORDER — SIMETHICONE 80 MG PO CHEW: 40.0000 mg | CHEWABLE_TABLET | Freq: Four times a day (QID) | ORAL | Status: DC | PRN

## 2022-09-29 MED ORDER — METHOCARBAMOL 500 MG PO TABS: 500.0000 mg | ORAL_TABLET | Freq: Three times a day (TID) | ORAL | Status: DC | PRN

## 2022-09-29 MED ORDER — DIPHENHYDRAMINE HCL 50 MG/ML IJ SOLN: 12.5000 mg | Freq: Four times a day (QID) | INTRAMUSCULAR | Status: DC | PRN

## 2022-09-29 MED ORDER — PIPERACILLIN-TAZOBACTAM 3.375 G IVPB 30 MIN: 3.3750 g | Freq: Three times a day (TID) | INTRAVENOUS | Status: DC

## 2022-09-29 MED ORDER — PROCHLORPERAZINE MALEATE 10 MG PO TABS: 10.0000 mg | ORAL_TABLET | Freq: Four times a day (QID) | ORAL | Status: DC | PRN

## 2022-09-29 MED ORDER — ONDANSETRON 4 MG PO TBDP: 4.0000 mg | ORAL_TABLET | Freq: Four times a day (QID) | ORAL | Status: DC | PRN

## 2022-09-29 MED ORDER — MORPHINE SULFATE (PF) 2 MG/ML IV SOLN: 2.0000 mg | INTRAVENOUS | Status: DC | PRN

## 2022-09-29 MED ORDER — ONDANSETRON HCL 4 MG/2ML IJ SOLN
4.0000 mg | Freq: Once | INTRAMUSCULAR | Status: AC
  Administered 2022-09-29: 4 mg via INTRAVENOUS
  Filled 2022-09-29: qty 2

## 2022-09-29 NOTE — ED Triage Notes (Signed)
Pt from home via pov with epigastric pain since Thursday. Pt reports that it has been fairly constant, and that he has had no appetite. He describes the pain as constant, and reports that he had some pain in the back at some point, but that has resolved. He also reports some pain in the right shoulder. Pt alert & oriented, nad noted.

## 2022-09-29 NOTE — Consult Note (Addendum)
Fred Thornton 1946-06-08  [id number].    Requesting MD: Dina Rich, MD Chief Complaint/Reason for Consult: symptomatic cholelithiasis  HPI:  Fred Thornton is a 77 y/o M with a PMH prostate CA s/p radical prostatectomy 2016 and cholelithiasis who presents with a cc upper abdominal pain. Patient noticed the pain on Thursday of last week - 6 days ago. Pain described as dull in the epigastric area initially but now focal to RUQ. He denies similar pain in the past.  He does not notice any exacerbation/alleviation with PO intake but has had reduced appetite. He took ibuprofen without relief of pain and does not take ibuprofen on a regular basis. Associated sxs include decreased PO intake and non-bloody emesis x2. Not having nausea otherwise. Having regular bowel movements. Denies fever or chills. He presented to his PCP yesterday for evaluation where abdominal ultrasound showed cholelithiasis without signs of cholecystitis, CBC WNL, creatinine 1.8, and bilirubin 1.3. H pylori breath test was negative.  He was advised to stop taking NSAIDs, go get RUQ U/S, and return if he develops worsening pain. [facility] pain.  He denies history of PUD or gastritis. Pain improved today with morphine  Blood thinners: none Prior surgeries: robotic assisted radical prostatectomy, bilateral hip arthroplasty   ROS: ROS reviewed and negative except as above  Family History  Problem Relation Age of Onset   Heart block Father    Breast cancer Mother     Past Medical History:  Diagnosis Date   Arthritis    Asthma    hx of in childhood    Hypercholesteremia    Malignant neoplasm ([place])    Skin   Melanoma ([place])    right upper arm   Microhematuria    Numbness    right first finger    Prostate cancer ([place]) 11/15/2014   Urinary straining    Weak urinary stream     Past Surgical History:  Procedure Laterality Date   hx of dermatological surgery      hx of Metacarpal Amputation     and Index finger   LYMPHADENECTOMY Bilateral 03/20/2015   Procedure: BILATERAL PELVIC LYMPHADENECTOMY;  Surgeon: Raynelle Bring, MD;  Location: [facility];  Service: Urology;  Laterality: Bilateral;   PROSTATE BIOPSY  11/15/14   ROBOT ASSISTED LAPAROSCOPIC RADICAL PROSTATECTOMY N/A 03/20/2015   Procedure: ROBOTIC ASSISTED LAPAROSCOPIC RADICAL PROSTATECTOMY (LEVEL 2);  Surgeon: Raynelle Bring, MD;  Location: [facility];  Service: Urology;  Laterality: N/A;   TOTAL HIP ARTHROPLASTY Right 01/10/2018   Procedure: RIGHT TOTAL HIP ARTHROPLASTY ANTERIOR APPROACH;  Surgeon: Paralee Cancel, MD;  Location: [facility];  Service: Orthopedics;  Laterality: Right;  70 mins   TOTAL HIP ARTHROPLASTY Left 09/22/2021   Procedure: TOTAL HIP ARTHROPLASTY ANTERIOR APPROACH;  Surgeon: Paralee Cancel, MD;  [facility];  Service: Orthopedics;  Laterality: Left;    Social History:  reports that he has never smoked. He has never used smokeless tobacco. He reports current alcohol use. He reports that he does not use drugs.  Allergies:  Allergies  Allergen Reactions   Multihance [Gadobenate] Nausea And Vomiting    Within a minute of receiving MRI contrast the patient projectile vomited for several minutes//he had just eaten lunch prior to scan.     Amoxicillin Rash    Has patient had a PCN reaction causing immediate rash, facial/tongue/throat swelling, SOB or lightheadedness with hypotension: Yes Has patient had a PCN reaction causing severe rash involving  mucus membranes or skin necrosis: No Has patient had a PCN reaction that required hospitalization: No Has patient had a PCN reaction occurring within the last 10 years: No If all of the above answers are "NO", then may proceed with Cephalosporin use.     (Not in a hospital admission)    Physical Exam: Blood pressure 119/76, pulse 74, temperature 98.7 F (37.1 C), resp. rate 12, height 6' 1.5" (1.867 m), weight 100.7 kg, SpO2 92  %. PE: General: pleasant, WD, male who is laying in bed in NAD HEENT: head is normocephalic, atraumatic.  Sclera are noninjected.  Pupils equal and round. EOMs intact.  Ears and nose without any masses or lesions.  Mouth is pink and moist Heart: regular, rate, and rhythm. Palpable radial pulse Lungs: CTAB, no wheezes, rhonchi, or rales noted.  Respiratory effort nonlabored Abd: soft, ND, no masses, or organomegaly. Small soft reducible umbilical hernia. Mild focal TTP in RUQ without rebound or guarding MSK: all 4 extremities are symmetrical with no cyanosis, clubbing, or edema. Calves nontender non edematous Skin: warm and dry with no masses, lesions, or rashes Neuro: Cranial nerves 2-12 grossly intact, sensation is normal throughout Psych: A&Ox3 with an appropriate affect.    Results for orders placed or performed during the hospital encounter of 09/29/22 (from the past 48 hour(s))  CBC with Differential     Status: Abnormal   Collection Time: 09/29/22 11:49 AM  Result Value Ref Range   WBC 10.3 4.0 - 10.5 K/uL   RBC 5.47 4.22 - 5.81 MIL/uL   Hemoglobin 17.0 13.0 - 17.0 g/dL   HCT 49.4 39.0 - 52.0 %   MCV 90.3 80.0 - 100.0 fL   MCH 31.1 26.0 - 34.0 pg   MCHC 34.4 30.0 - 36.0 g/dL   RDW 13.2 11.5 - 15.5 %   Platelets 174 150 - 400 K/uL   nRBC 0.0 0.0 - 0.2 %   Neutrophils Relative % 85 %   Neutro Abs 8.8 (H) 1.7 - 7.7 K/uL   Lymphocytes Relative 8 %   Lymphs Abs 0.8 0.7 - 4.0 K/uL   Monocytes Relative 6 %   Monocytes Absolute 0.6 0.1 - 1.0 K/uL   Eosinophils Relative 1 %   Eosinophils Absolute 0.1 0.0 - 0.5 K/uL   Basophils Relative 0 %   Basophils Absolute 0.0 0.0 - 0.1 K/uL   Immature Granulocytes 0 %   Abs Immature Granulocytes 0.02 0.00 - 0.07 K/uL    Comment: Performed at [facility], [place], [place] [address]  Comprehensive metabolic panel     Status: Abnormal   Collection Time: 09/29/22 11:49 AM  Result Value Ref Range   Sodium  140 135 - 145 mmol/L   Potassium 3.4 (L) 3.5 - 5.1 mmol/L   Chloride 103 98 - 111 mmol/L   CO2 24 22 - 32 mmol/L   Glucose, Bld 147 (H) 70 - 99 mg/dL    Comment: Glucose reference range applies only to samples taken after fasting for at least 8 hours.   BUN 34 (H) 8 - 23 mg/dL   Creatinine, Ser 1.58 (H) 0.61 - 1.24 mg/dL   Calcium 9.7 8.9 - 10.3 mg/dL   Total Protein 8.1 6.5 - 8.1 g/dL   Albumin 4.0 3.5 - 5.0 g/dL   AST 12 (L) 15 - 41 U/L   ALT 18 0 - 44 U/L   Alkaline Phosphatase 63 38 - 126 U/L   Total Bilirubin 1.4 (H)  0.3 - 1.2 mg/dL   GFR, Estimated 46 (L) >60 mL/min    Comment: (NOTE) Calculated using the CKD-EPI Creatinine Equation (2021)    Anion gap 13 5 - 15    Comment: Performed at [facility], [place], [place] [address]  Lipase, blood     Status: None   Collection Time: 09/29/22 11:49 AM  Result Value Ref Range   Lipase 13 11 - 51 U/L    Comment: Performed at [facility], [place], [place] [address]  Urinalysis, Routine w reflex microscopic -Urine, Clean Catch     Status: Abnormal   Collection Time: 09/29/22 11:49 AM  Result Value Ref Range   Color, Urine YELLOW YELLOW   APPearance CLEAR CLEAR   Specific Gravity, Urine 1.030 1.005 - 1.030   pH 5.5 5.0 - 8.0   Glucose, UA NEGATIVE NEGATIVE mg/dL   Hgb urine dipstick SMALL (A) NEGATIVE   Bilirubin Urine NEGATIVE NEGATIVE   Ketones, ur NEGATIVE NEGATIVE mg/dL   Protein, ur 30 (A) NEGATIVE mg/dL   Nitrite NEGATIVE NEGATIVE   Leukocytes,Ua NEGATIVE NEGATIVE   RBC / HPF 0-5 0 - 5 RBC/hpf   WBC, UA 11-20 0 - 5 WBC/hpf   Bacteria, UA RARE (A) NONE SEEN   Squamous Epithelial / HPF 0-5 0 - 5 /HPF   Mucus PRESENT    Hyaline Casts, UA PRESENT     Comment: Performed at [facility], [address], [place], [place] [address]   US Abdomen Limited RUQ (LIVER/GB)  Result Date: 09/29/2022 CLINICAL DATA:  Right upper quadrant  abdominal pain EXAM: ULTRASOUND ABDOMEN LIMITED RIGHT UPPER QUADRANT COMPARISON:  09/28/2022 FINDINGS: Gallbladder: Gallstones, sludge, and gallbladder wall thickening up to 0.6 cm. Small volume pericholecystic fluid. No sonographic Murphy sign noted by sonographer. Common bile duct: Diameter: Common bile duct measures up to 0.7 cm Liver: No focal lesion identified. Increased parenchymal echogenicity. Portal vein is patent on color Doppler imaging with normal direction of blood flow towards the liver. Other: Trace perihepatic ascites. IMPRESSION: 1. Gallstones, sludge, and gallbladder wall thickening. Small volume pericholecystic fluid. Negative sonographic Murphy sign. Findings are similar to previous day's examination and again concerning for acute cholecystitis. 2. Common bile duct measures up to 0.7 cm, upper limits of normal. 3. Hepatic steatosis. 4. Trace perihepatic ascites. Electronically Signed   By: Delanna Ahmadi M.D.   On: 09/29/2022 13:54   US Abdomen Complete  Result Date: 09/28/2022 CLINICAL DATA:  Epigastric pain EXAM: ABDOMEN ULTRASOUND COMPLETE COMPARISON:  Abdominal ultrasound dated November 10, 2020. FINDINGS: Gallbladder: Marked gallbladder wall thickening. Gallstones visualized, largest measures 9 mm. No sonographic Murphy sign noted by sonographer. Common bile duct: Diameter: 5.6 mm Liver: No focal lesion identified. Increased parenchymal echogenicity. Portal vein is patent on color Doppler imaging with normal direction of blood flow towards the liver. IVC: No abnormality visualized. Pancreas: Not visualized due to overlying bowel gas. Spleen: Size and appearance within normal limits. Right Kidney: Length: 2.7. Echogenicity within normal limits. No mass or hydronephrosis visualized. Left Kidney: Length: 11.0. Echogenicity within normal limits. Simple appearing cyst of the lower pole measuring 1.3 cm. No mass or hydronephrosis visualized. Abdominal aorta: No aneurysm visualized. Other findings:  Trace perihepatic fluid. IMPRESSION: 1. Gallbladder wall thickening with cholelithiasis. Findings are suggestive of acute cholecystitis, although sonographic Murphy sign is negative. Gallbladder wall thickening could also be secondary to systemic process. Recommend clinical correlation. 2. Hepatic steatosis. 3. Trace perihepatic fluid. Electronically Signed  By: Yetta Glassman M.D.   On: 09/28/2022 16:57      Assessment/Plan Cholelithiasis, possible cholecystitis Patient seen and examined and relevant labs and imaging reviewed. Findings and exam consistent with symptomatic cholelithiasis. T bili up slightly at 1.4. Recommend definitive treatment with laparoscopic cholecystectomy. Will admit to observation, abx and IV fluids and plan for lap chole IOC tomorrow.  AKI-  Cr 1.5 from 1.8 yesterday, IVF  FEN - clears, NPO midnight, hypokalemia - IVF with potassium VTE - lovenox ID - rocephin  I reviewed ED provider notes, last 24 h vitals and pain scores, last 48 h intake and output, last 24 h labs and trends, and last 24 h imaging results.  Fred Thornton, [facility] Surgery 09/29/2022, 3:04 PM Please see Amion for pager number during day hours 7:00am-4:30pm or 7:00am -11:30am on weekends

## 2022-09-29 NOTE — ED Notes (Signed)
Patients room is still dirty. Cleaning will start soon then patient will be transferred.

## 2022-09-29 NOTE — ED Notes (Signed)
Patients wife Judson Roch at bedside.

## 2022-09-29 NOTE — ED Notes (Signed)
Pt had ultrasound yesterday and labs on 09/23/22.

## 2022-09-29 NOTE — ED Notes (Signed)
ED TO INPATIENT HANDOFF REPORT  ED Nurse Name and Phone #:  Lorenda Ishihara [phone]  S Name/Age/Gender Fred Thornton 77 y.o. male Room/Bed: 007C/007C  Code Status   Code Status: Full Code  Home/SNF/Other Home Patient oriented to: self, place, time, and situation Is this baseline? Yes   Triage Complete: Triage complete  Chief Complaint Acute cholecystitis [K81.0]  Triage Note Pt from home via pov with epigastric pain since Thursday. Pt reports that it has been fairly constant, and that he has had no appetite. He describes the pain as constant, and reports that he had some pain in the back at some point, but that has resolved. He also reports some pain in the right shoulder. Pt alert & oriented, nad noted.    Allergies Allergies  Allergen Reactions   Multihance [Gadobenate] Nausea And Vomiting    Within a minute of receiving MRI contrast the patient projectile vomited for several minutes//he had just eaten lunch prior to scan.     Amoxicillin Rash    Has patient had a PCN reaction causing immediate rash, facial/tongue/throat swelling, SOB or lightheadedness with hypotension: Yes Has patient had a PCN reaction causing severe rash involving mucus membranes or skin necrosis: No Has patient had a PCN reaction that required hospitalization: No Has patient had a PCN reaction occurring within the last 10 years: No If all of the above answers are "NO", then may proceed with Cephalosporin use.     Level of Care/Admitting Diagnosis ED Disposition     ED Disposition  Admit   Condition  --   Comment  Hospital Area: [place] [[id number]]  Level of Care: Med-Surg [16]  May place patient in observation at [facility] or [place] if equivalent level of care is available:: No  Covid Evaluation: Asymptomatic - no recent exposure (last 10 days) testing not required  Diagnosis: Acute cholecystitis [575.0.ICD-9-CM]  Admitting Physician: [place], [place]  Attending Physician:  CCS, MD [[id number]]          B Medical/Surgery History Past Medical History:  Diagnosis Date   Arthritis    Asthma    hx of in childhood    Hypercholesteremia    Malignant neoplasm ([place])    Skin   Melanoma ([place])    right upper arm   Microhematuria    Numbness    right first finger    Prostate cancer ([place]) 11/15/2014   Urinary straining    Weak urinary stream    Past Surgical History:  Procedure Laterality Date   hx of dermatological surgery     hx of Metacarpal Amputation     and Index finger   LYMPHADENECTOMY Bilateral 03/20/2015   Procedure: BILATERAL PELVIC LYMPHADENECTOMY;  Surgeon: Raynelle Bring, MD;  [facility];  Service: Urology;  Laterality: Bilateral;   PROSTATE BIOPSY  11/15/14   ROBOT ASSISTED LAPAROSCOPIC RADICAL PROSTATECTOMY N/A 03/20/2015   Procedure: ROBOTIC ASSISTED LAPAROSCOPIC RADICAL PROSTATECTOMY (LEVEL 2);  Surgeon: Raynelle Bring, MD;  [facility];  Service: Urology;  Laterality: N/A;   TOTAL HIP ARTHROPLASTY Right 01/10/2018   Procedure: RIGHT TOTAL HIP ARTHROPLASTY ANTERIOR APPROACH;  Surgeon: Paralee Cancel, MD;  [facility];  Service: Orthopedics;  Laterality: Right;  70 mins   TOTAL HIP ARTHROPLASTY Left 09/22/2021   Procedure: TOTAL HIP ARTHROPLASTY ANTERIOR APPROACH;  Surgeon: Paralee Cancel, MD;  [facility];  Service: Orthopedics;  Laterality: Left;     A IV Location/Drains/Wounds Patient Lines/Drains/Airways Status     Active  Line/Drains/Airways     Name Placement date Placement time Site Days   Peripheral IV 09/29/22 20 G Posterior;Right Forearm 09/29/22  1149  Forearm  less than 1            Intake/Output Last 24 hours No intake or output data in the 24 hours ending 09/29/22 1714  Labs/Imaging Results for orders placed or performed during the hospital encounter of 09/29/22 (from the past 48 hour(s))  CBC with Differential     Status: Abnormal   Collection Time: 09/29/22 11:49 AM  Result Value Ref Range   WBC  10.3 4.0 - 10.5 K/uL   RBC 5.47 4.22 - 5.81 MIL/uL   Hemoglobin 17.0 13.0 - 17.0 g/dL   HCT 49.4 39.0 - 52.0 %   MCV 90.3 80.0 - 100.0 fL   MCH 31.1 26.0 - 34.0 pg   MCHC 34.4 30.0 - 36.0 g/dL   RDW 13.2 11.5 - 15.5 %   Platelets 174 150 - 400 K/uL   nRBC 0.0 0.0 - 0.2 %   Neutrophils Relative % 85 %   Neutro Abs 8.8 (H) 1.7 - 7.7 K/uL   Lymphocytes Relative 8 %   Lymphs Abs 0.8 0.7 - 4.0 K/uL   Monocytes Relative 6 %   Monocytes Absolute 0.6 0.1 - 1.0 K/uL   Eosinophils Relative 1 %   Eosinophils Absolute 0.1 0.0 - 0.5 K/uL   Basophils Relative 0 %   Basophils Absolute 0.0 0.0 - 0.1 K/uL   Immature Granulocytes 0 %   Abs Immature Granulocytes 0.02 0.00 - 0.07 K/uL    Comment: Performed at [facility], [place], [place] [address]  Comprehensive metabolic panel     Status: Abnormal   Collection Time: 09/29/22 11:49 AM  Result Value Ref Range   Sodium 140 135 - 145 mmol/L   Potassium 3.4 (L) 3.5 - 5.1 mmol/L   Chloride 103 98 - 111 mmol/L   CO2 24 22 - 32 mmol/L   Glucose, Bld 147 (H) 70 - 99 mg/dL    Comment: Glucose reference range applies only to samples taken after fasting for at least 8 hours.   BUN 34 (H) 8 - 23 mg/dL   Creatinine, Ser 1.58 (H) 0.61 - 1.24 mg/dL   Calcium 9.7 8.9 - 10.3 mg/dL   Total Protein 8.1 6.5 - 8.1 g/dL   Albumin 4.0 3.5 - 5.0 g/dL   AST 12 (L) 15 - 41 U/L   ALT 18 0 - 44 U/L   Alkaline Phosphatase 63 38 - 126 U/L   Total Bilirubin 1.4 (H) 0.3 - 1.2 mg/dL   GFR, Estimated 46 (L) >60 mL/min    Comment: (NOTE) Calculated using the CKD-EPI Creatinine Equation (2021)    Anion gap 13 5 - 15    Comment: Performed at [facility], [place], [place] [address]  Lipase, blood     Status: None   Collection Time: 09/29/22 11:49 AM  Result Value Ref Range   Lipase 13 11 - 51 U/L    Comment: Performed at [facility], [place], [place] [address]   Urinalysis, Routine w reflex microscopic -Urine, Clean Catch     Status: Abnormal   Collection Time: 09/29/22 11:49 AM  Result Value Ref Range   Color, Urine YELLOW YELLOW   APPearance CLEAR CLEAR   Specific Gravity, Urine 1.030 1.005 - 1.030   pH 5.5 5.0 - 8.0   Glucose,  UA NEGATIVE NEGATIVE mg/dL   Hgb urine dipstick SMALL (A) NEGATIVE   Bilirubin Urine NEGATIVE NEGATIVE   Ketones, ur NEGATIVE NEGATIVE mg/dL   Protein, ur 30 (A) NEGATIVE mg/dL   Nitrite NEGATIVE NEGATIVE   Leukocytes,Ua NEGATIVE NEGATIVE   RBC / HPF 0-5 0 - 5 RBC/hpf   WBC, UA 11-20 0 - 5 WBC/hpf   Bacteria, UA RARE (A) NONE SEEN   Squamous Epithelial / HPF 0-5 0 - 5 /HPF   Mucus PRESENT    Hyaline Casts, UA PRESENT     Comment: Performed at [facility], [address] [place], [place] [address]   US Abdomen Limited RUQ (LIVER/GB)  Result Date: 09/29/2022 CLINICAL DATA:  Right upper quadrant abdominal pain EXAM: ULTRASOUND ABDOMEN LIMITED RIGHT UPPER QUADRANT COMPARISON:  09/28/2022 FINDINGS: Gallbladder: Gallstones, sludge, and gallbladder wall thickening up to 0.6 cm. Small volume pericholecystic fluid. No sonographic Murphy sign noted by sonographer. Common bile duct: Diameter: Common bile duct measures up to 0.7 cm Liver: No focal lesion identified. Increased parenchymal echogenicity. Portal vein is patent on color Doppler imaging with normal direction of blood flow towards the liver. Other: Trace perihepatic ascites. IMPRESSION: 1. Gallstones, sludge, and gallbladder wall thickening. Small volume pericholecystic fluid. Negative sonographic Murphy sign. Findings are similar to previous day's examination and again concerning for acute cholecystitis. 2. Common bile duct measures up to 0.7 cm, upper limits of normal. 3. Hepatic steatosis. 4. Trace perihepatic ascites. Electronically Signed   By: Delanna Ahmadi M.D.   On: 09/29/2022 13:54   US Abdomen Complete  Result Date: 09/28/2022 CLINICAL  DATA:  Epigastric pain EXAM: ABDOMEN ULTRASOUND COMPLETE COMPARISON:  Abdominal ultrasound dated November 10, 2020. FINDINGS: Gallbladder: Marked gallbladder wall thickening. Gallstones visualized, largest measures 9 mm. No sonographic Murphy sign noted by sonographer. Common bile duct: Diameter: 5.6 mm Liver: No focal lesion identified. Increased parenchymal echogenicity. Portal vein is patent on color Doppler imaging with normal direction of blood flow towards the liver. IVC: No abnormality visualized. Pancreas: Not visualized due to overlying bowel gas. Spleen: Size and appearance within normal limits. Right Kidney: Length: 2.7. Echogenicity within normal limits. No mass or hydronephrosis visualized. Left Kidney: Length: 11.0. Echogenicity within normal limits. Simple appearing cyst of the lower pole measuring 1.3 cm. No mass or hydronephrosis visualized. Abdominal aorta: No aneurysm visualized. Other findings: Trace perihepatic fluid. IMPRESSION: 1. Gallbladder wall thickening with cholelithiasis. Findings are suggestive of acute cholecystitis, although sonographic Murphy sign is negative. Gallbladder wall thickening could also be secondary to systemic process. Recommend clinical correlation. 2. Hepatic steatosis. 3. Trace perihepatic fluid. Electronically Signed   By: Yetta Glassman M.D.   On: 09/28/2022 16:57    Pending Labs Unresulted Labs (From admission, onward)     Start     Ordered   10/06/22 0500  Creatinine, serum  (enoxaparin (LOVENOX)    CrCl >/= 30 ml/min)  Weekly,   R     Comments: while on enoxaparin therapy    09/29/22 1641   09/30/22 0500  Comprehensive metabolic panel  Tomorrow morning,   R        09/29/22 1641   09/30/22 0500  CBC  Tomorrow morning,   R        09/29/22 1641            Vitals/Pain Today's Vitals   09/29/22 1600 09/29/22 1615 09/29/22 1700 09/29/22 1700  BP: (!) 151/78 119/71 130/70   Pulse: 76 75 73   Resp: 20  20 18   Temp:   98.4 F (36.9 C)    TempSrc:   Oral   SpO2: 92% 90% 93%   Weight:      Height:      PainSc:    1     Isolation Precautions No active isolations  Medications Medications  enoxaparin (LOVENOX) injection 40 mg (has no administration in time range)  0.9 % NaCl with KCl 20 mEq/ L  infusion (has no administration in time range)  metoprolol tartrate (LOPRESSOR) injection 5 mg (has no administration in time range)  pantoprazole (PROTONIX) injection 40 mg (has no administration in time range)  simethicone (MYLICON) chewable tablet 40 mg (has no administration in time range)  ondansetron (ZOFRAN-ODT) disintegrating tablet 4 mg (has no administration in time range)    Or  ondansetron (ZOFRAN) injection 4 mg (has no administration in time range)  prochlorperazine (COMPAZINE) tablet 10 mg (has no administration in time range)    Or  prochlorperazine (COMPAZINE) injection 5-10 mg (has no administration in time range)  polyethylene glycol (MIRALAX / GLYCOLAX) packet 17 g (has no administration in time range)  diphenhydrAMINE (BENADRYL) 12.5 MG/5ML elixir 12.5 mg (has no administration in time range)    Or  diphenhydrAMINE (BENADRYL) injection 12.5 mg (has no administration in time range)  methocarbamol (ROBAXIN) tablet 500 mg (has no administration in time range)    Or  methocarbamol (ROBAXIN) 500 mg in dextrose 5 % 50 mL IVPB (has no administration in time range)  morphine (PF) 2 MG/ML injection 2-4 mg (has no administration in time range)  oxyCODONE (Oxy IR/ROXICODONE) immediate release tablet 5-10 mg (has no administration in time range)  acetaminophen (TYLENOL) tablet 650 mg (has no administration in time range)    Or  acetaminophen (TYLENOL) suppository 650 mg (has no administration in time range)  Chlorhexidine Gluconate Cloth 2 % PADS 6 each (has no administration in time range)    And  Chlorhexidine Gluconate Cloth 2 % PADS 6 each (has no administration in time range)  piperacillin-tazobactam (ZOSYN)  IVPB 3.375 g (has no administration in time range)  sodium chloride 0.9 % bolus 500 mL (500 mLs Intravenous New Bag/Given 09/29/22 1344)  ondansetron (ZOFRAN) injection 4 mg (4 mg Intravenous Given 09/29/22 1343)  morphine (PF) 4 MG/ML injection 4 mg (4 mg Intravenous Given 09/29/22 1344)    Mobility walks     Focused Assessments GI assessment,  RLQ pain 1/10 when still and 7/10 when moving.  Hypoactive bowels sounds on RS    R Recommendations: See Admitting Provider Note  Report given to:   Additional Notes:   Wife at bedside

## 2022-09-29 NOTE — Anesthesia Preprocedure Evaluation (Signed)
Anesthesia Evaluation  Patient identified by MRN, date of birth, ID band Patient awake    Reviewed: Allergy & Precautions, NPO status , Patient's Chart, lab work & pertinent test results  History of Anesthesia Complications Negative for: history of anesthetic complications  Airway Mallampati: III  TM Distance: >3 FB Neck ROM: Full   Comment: Previous grade I view with Miller 3, easy mask with [employer]  (+) Dental Advisory Given,    Pulmonary neg shortness of breath, asthma (in junior high) , neg sleep apnea, neg COPD, neg recent URI   Pulmonary exam normal breath sounds clear to auscultation       Cardiovascular (-) hypertension(-) angina (-) Past MI, (-) Cardiac Stents and (-) CABG (-) dysrhythmias  Rhythm:Regular Rate:Normal  HLD   Neuro/Psych negative neurological ROS     GI/Hepatic Neg liver ROS,neg GERD  ,,Acute cholecystitis   Endo/Other  negative endocrine ROS    Renal/GU negative Renal ROS Bladder dysfunction  H/o prostate cancer s/p prostatectomy    Musculoskeletal  (+) Arthritis ,    Abdominal   Peds  Hematology negative hematology ROS (+)   Anesthesia Other Findings   Reproductive/Obstetrics                             Anesthesia Physical Anesthesia Plan  ASA: 2  Anesthesia Plan: General   Post-op Pain Management:    Induction: Intravenous  PONV Risk Score and Plan: 2 and Ondansetron, Dexamethasone and Treatment may vary due to age or medical condition  Airway Management Planned: Oral ETT  Additional Equipment:   Intra-op Plan:   Post-operative Plan: Extubation in OR  Informed Consent: I have reviewed the patients History and Physical, chart, labs and discussed the procedure including the risks, benefits and alternatives for the proposed anesthesia with the patient or authorized representative who has indicated his/her understanding and acceptance.      Dental advisory given  Plan Discussed with: CRNA and Anesthesiologist  Anesthesia Plan Comments: (Risks of general anesthesia discussed including, but not limited to, sore throat, hoarse voice, chipped/damaged teeth, injury to vocal cords, nausea and vomiting, allergic reactions, lung infection, heart attack, stroke, and death. All questions answered. )        Anesthesia Quick Evaluation

## 2022-09-29 NOTE — Discharge Instructions (Addendum)

## 2022-09-29 NOTE — ED Provider Notes (Signed)
[facility] Provider Note   CSN: [id number] Arrival date & time: 09/29/22  1055     History  Chief Complaint  Patient presents with   Abdominal Pain    Fred Thornton is a 77 y.o. male.  77 yo M with history of prostate cancer and cholelithiasis presents with epigastric abdominal pain x5-6 days. He reports that he has had constant, dull pain in his upper right quadrant now radiating to back and right shoulder. He has taken ibuprofen with minimal relief (does not take NSAIDs regularly), but states it tends to improve with eating. Reports pain at a 7-8/10 at time of evaluation. No history of heartburn or fevers. He is anorexic and has had two episodes of emesis since Thursday. He was seen yesterday and had ultrasound done but did not receive the results yet. No history of abdominal surgery but did have his prostate removed a few years ago. Not on blood thinners, no prior abdominal surgeries.        Home Medications Prior to Admission medications   Medication Sig Start Date End Date Taking? Authorizing Provider  celecoxib (CELEBREX) 200 MG capsule Take 1 capsule (200 mg total) by mouth 2 (two) times daily. 09/23/21   Irving Copas, PA-C  HYDROcodone-acetaminophen (NORCO/VICODIN) 5-325 MG tablet Take 1-2 tablets by mouth every 6 (six) hours as needed for moderate pain (pain score 4-6). 09/23/21   Irving Copas, PA-C  methocarbamol (ROBAXIN) 500 MG tablet Take 1 tablet (500 mg total) by mouth every 6 (six) hours as needed for muscle spasms. 09/23/21   Irving Copas, PA-C  polyethylene glycol (MIRALAX / GLYCOLAX) 17 g packet Take 17 g by mouth daily as needed for mild constipation. 09/23/21   Irving Copas, PA-C  rosuvastatin (CRESTOR) 5 MG tablet Take 5 mg by mouth daily.    [provider]      Allergies    Multihance [gadobenate] and Amoxicillin    Review of Systems   Review of Systems Negative except as per  HPI Physical Exam Updated Vital Signs BP 130/70 (BP Location: Right Arm)   Pulse 73   Temp 98.4 F (36.9 C) (Oral)   Resp 18   Ht 6' 1.5" (1.867 m)   Wt 100.7 kg   SpO2 93%   BMI 28.89 kg/m  Physical Exam Vitals and nursing note reviewed.  Constitutional:      General: He is not in acute distress.    Appearance: He is well-developed. He is not diaphoretic.  HENT:     Head: Normocephalic and atraumatic.  Cardiovascular:     Rate and Rhythm: Normal rate and regular rhythm.     Heart sounds: Normal heart sounds.  Pulmonary:     Effort: Pulmonary effort is normal.     Breath sounds: Normal breath sounds.  Abdominal:     Palpations: Abdomen is soft.     Tenderness: There is abdominal tenderness in the right upper quadrant.  Skin:    General: Skin is warm and dry.     Findings: No erythema or rash.  Neurological:     Mental Status: He is alert and oriented to person, place, and time.  Psychiatric:        Behavior: Behavior normal.     ED Results / Procedures / Treatments   Labs (all labs ordered are listed, but only abnormal results are displayed) Labs Reviewed  CBC WITH DIFFERENTIAL/PLATELET - Abnormal; Notable for the following components:  Result Value   Neutro Abs 8.8 (*)    All other components within normal limits  COMPREHENSIVE METABOLIC PANEL - Abnormal; Notable for the following components:   Potassium 3.4 (*)    Glucose, Bld 147 (*)    BUN 34 (*)    Creatinine, Ser 1.58 (*)    AST 12 (*)    Total Bilirubin 1.4 (*)    GFR, Estimated 46 (*)    All other components within normal limits  URINALYSIS, ROUTINE W REFLEX MICROSCOPIC - Abnormal; Notable for the following components:   Hgb urine dipstick SMALL (*)    Protein, ur 30 (*)    Bacteria, UA RARE (*)    All other components within normal limits  LIPASE, BLOOD  COMPREHENSIVE METABOLIC PANEL  CBC    EKG None  Radiology US Abdomen Limited RUQ (LIVER/GB)  Result Date: 09/29/2022 CLINICAL  DATA:  Right upper quadrant abdominal pain EXAM: ULTRASOUND ABDOMEN LIMITED RIGHT UPPER QUADRANT COMPARISON:  09/28/2022 FINDINGS: Gallbladder: Gallstones, sludge, and gallbladder wall thickening up to 0.6 cm. Small volume pericholecystic fluid. No sonographic Foye Haggart sign noted by sonographer. Common bile duct: Diameter: Common bile duct measures up to 0.7 cm Liver: No focal lesion identified. Increased parenchymal echogenicity. Portal vein is patent on color Doppler imaging with normal direction of blood flow towards the liver. Other: Trace perihepatic ascites. IMPRESSION: 1. Gallstones, sludge, and gallbladder wall thickening. Small volume pericholecystic fluid. Negative sonographic Sonnet Rizor sign. Findings are similar to previous day's examination and again concerning for acute cholecystitis. 2. Common bile duct measures up to 0.7 cm, upper limits of normal. 3. Hepatic steatosis. 4. Trace perihepatic ascites. Electronically Signed   By: Delanna Ahmadi M.D.   On: 09/29/2022 13:54   US Abdomen Complete  Result Date: 09/28/2022 CLINICAL DATA:  Epigastric pain EXAM: ABDOMEN ULTRASOUND COMPLETE COMPARISON:  Abdominal ultrasound dated November 10, 2020. FINDINGS: Gallbladder: Marked gallbladder wall thickening. Gallstones visualized, largest measures 9 mm. No sonographic Leonette Tischer sign noted by sonographer. Common bile duct: Diameter: 5.6 mm Liver: No focal lesion identified. Increased parenchymal echogenicity. Portal vein is patent on color Doppler imaging with normal direction of blood flow towards the liver. IVC: No abnormality visualized. Pancreas: Not visualized due to overlying bowel gas. Spleen: Size and appearance within normal limits. Right Kidney: Length: 2.7. Echogenicity within normal limits. No mass or hydronephrosis visualized. Left Kidney: Length: 11.0. Echogenicity within normal limits. Simple appearing cyst of the lower pole measuring 1.3 cm. No mass or hydronephrosis visualized. Abdominal aorta: No aneurysm  visualized. Other findings: Trace perihepatic fluid. IMPRESSION: 1. Gallbladder wall thickening with cholelithiasis. Findings are suggestive of acute cholecystitis, although sonographic Liyanna Cartwright sign is negative. Gallbladder wall thickening could also be secondary to systemic process. Recommend clinical correlation. 2. Hepatic steatosis. 3. Trace perihepatic fluid. Electronically Signed   By: Yetta Glassman M.D.   On: 09/28/2022 16:57    Procedures Procedures    Medications Ordered in ED Medications  enoxaparin (LOVENOX) injection 40 mg (has no administration in time range)  0.9 % NaCl with KCl 20 mEq/ L  infusion (has no administration in time range)  metoprolol tartrate (LOPRESSOR) injection 5 mg (has no administration in time range)  pantoprazole (PROTONIX) injection 40 mg (has no administration in time range)  simethicone (MYLICON) chewable tablet 40 mg (has no administration in time range)  ondansetron (ZOFRAN-ODT) disintegrating tablet 4 mg (has no administration in time range)    Or  ondansetron (ZOFRAN) injection 4 mg (has no administration in time range)  prochlorperazine (COMPAZINE) tablet 10 mg (has no administration in time range)    Or  prochlorperazine (COMPAZINE) injection 5-10 mg (has no administration in time range)  polyethylene glycol (MIRALAX / GLYCOLAX) packet 17 g (has no administration in time range)  diphenhydrAMINE (BENADRYL) 12.5 MG/5ML elixir 12.5 mg (has no administration in time range)    Or  diphenhydrAMINE (BENADRYL) injection 12.5 mg (has no administration in time range)  methocarbamol (ROBAXIN) tablet 500 mg (has no administration in time range)    Or  methocarbamol (ROBAXIN) 500 mg in dextrose 5 % 50 mL IVPB (has no administration in time range)  morphine (PF) 2 MG/ML injection 2-4 mg (has no administration in time range)  oxyCODONE (Oxy IR/ROXICODONE) immediate release tablet 5-10 mg (has no administration in time range)  acetaminophen (TYLENOL) tablet  650 mg (has no administration in time range)    Or  acetaminophen (TYLENOL) suppository 650 mg (has no administration in time range)  Chlorhexidine Gluconate Cloth 2 % PADS 6 each (has no administration in time range)    And  Chlorhexidine Gluconate Cloth 2 % PADS 6 each (has no administration in time range)  piperacillin-tazobactam (ZOSYN) IVPB 3.375 g (3.375 g Intravenous New Bag/Given 09/29/22 1745)  sodium chloride 0.9 % bolus 500 mL (500 mLs Intravenous New Bag/Given 09/29/22 1344)  ondansetron (ZOFRAN) injection 4 mg (4 mg Intravenous Given 09/29/22 1343)  morphine (PF) 4 MG/ML injection 4 mg (4 mg Intravenous Given 09/29/22 1344)    ED Course/ Medical Decision Making/ A&P                             Medical Decision Making Amount and/or Complexity of Data Reviewed Labs: ordered. Radiology: ordered.  Risk Prescription drug management. Decision regarding hospitalization.   This patient presents to the ED for concern of right upper quadrant abdominal pain with nausea and vomiting, this involves an extensive number of treatment options, and is a complaint that carries with it a high risk of complications and morbidity.  The differential diagnosis includes but not limited to acute cholecystitis, pancreatitis, gastritis, peptic ulcer disease   Co morbidities that complicate the patient evaluation  Hyperlipidemia, asthma, history of melanoma and prostate cancer   Additional history obtained:  Additional history obtained from wife at bedside who contributes to history as above External records from outside source obtained and reviewed including ultrasound report from yesterday with concern for acute cholecystitis   Lab Tests:  I Ordered, and personally interpreted labs.  The pertinent results include: CBC with normal WBC.  CMP with mild hypokalemia at 3.4.  Creatinine minimally elevated at 1.58, BUN 34.  Bilirubin elevated 1.4 with normal LFTs.  Lipase normal.  Urinalysis with  small hemoglobin, protein.   Imaging Studies ordered:  I ordered imaging studies including US gallbladder  I independently visualized and interpreted imaging which showed gallbladder wall thickening, biliary ductal dilatation I agree with the radiologist interpretation  Consultations Obtained:  I requested consultation with the general surgery team, Melina Modena, PA-C,  and discussed lab and imaging findings as well as pertinent plan - they recommend: transfer to [facility] ER for surgery to evaluate, please message Margie Billet and ot Talvin Miu on arrival.    Problem List / ED Course / Critical interventions / Medication management  77 year old male with concern for right upper quadrant pain with nausea and vomiting, progressively worsening over the past few days.  On exam with right upper quadrant  tenderness.  Labs show normal WBC, is afebrile, normal LFTs with mildly elevated bili.  Ultrasound with concern for acute cholecystitis, biliary duct dilatation, gallbladder wall thickening.  Discussed with general surgery who request transfer to [facility] ED for evaluation consideration for cholecystectomy.  Patient transferred via [place] with wife driving.  Patient was transferred with his IV in place, aware he is n.p.o. I ordered medication including morphine, Zofran, IV fluids for pain, elevated creatinine Reevaluation of the patient after these medicines showed that the patient improved I have reviewed the patients home medicines and have made adjustments as needed   Social Determinants of Health:  Lives with family, has PCP   Test / Admission - Considered:  Transfer to [facility] for further evaluation         Final Clinical Impression(s) / ED Diagnoses Final diagnoses:  Right upper quadrant pain    Rx / DC Orders ED Discharge Orders     None         Tacy Learn, PA-C 09/29/22 1800    Horton, Alvin Critchley, DO 09/30/22 1450

## 2022-09-29 NOTE — H&P (View-Only) (Signed)
Fred Thornton 24-Apr-1946  [id number].    Requesting MD: Fred Rich, MD Chief Complaint/Reason for Consult: symptomatic cholelithiasis  HPI:  Fred Thornton is a 77 y/o M with a PMH prostate CA s/p radical prostatectomy 2016 and cholelithiasis who presents with a cc upper abdominal pain. Patient noticed the pain on Thursday of last week - 6 days ago. Pain described as dull in the epigastric area initially but now focal to RUQ. He denies similar pain in the past.  He does not notice any exacerbation/alleviation with PO intake but has had reduced appetite. He took ibuprofen without relief of pain and does not take ibuprofen on a regular basis. Associated sxs include decreased PO intake and non-bloody emesis x2. Not having nausea otherwise. Having regular bowel movements. Denies fever or chills. He presented to his PCP yesterday for evaluation where abdominal ultrasound showed cholelithiasis without signs of cholecystitis, CBC WNL, creatinine 1.8, and bilirubin 1.3. H pylori breath test was negative.  He was advised to stop taking NSAIDs, go get RUQ U/S, and return if he develops worsening pain. [facility] due to worsening pain.  He denies history of PUD or gastritis. Pain improved today with morphine  Blood thinners: none Prior surgeries: robotic assisted radical prostatectomy, bilateral hip arthroplasty   ROS: ROS reviewed and negative except as above  Family History  Problem Relation Age of Onset   Heart block Father    Breast cancer Mother     Past Medical History:  Diagnosis Date   Arthritis    Asthma    hx of in childhood    Hypercholesteremia    Malignant neoplasm ([place])    Skin   Melanoma ([place])    right upper arm   Microhematuria    Numbness    right first finger    Prostate cancer ([place]) 11/15/2014   Urinary straining    Weak urinary stream     Past Surgical History:  Procedure Laterality Date   hx of dermatological surgery      hx of Metacarpal Amputation     and Index finger   LYMPHADENECTOMY Bilateral 03/20/2015   Procedure: BILATERAL PELVIC LYMPHADENECTOMY;  Surgeon: Raynelle Bring, MD;  Location: [facility];  Service: Urology;  Laterality: Bilateral;   PROSTATE BIOPSY  11/15/14   ROBOT ASSISTED LAPAROSCOPIC RADICAL PROSTATECTOMY N/A 03/20/2015   Procedure: ROBOTIC ASSISTED LAPAROSCOPIC RADICAL PROSTATECTOMY (LEVEL 2);  Surgeon: Raynelle Bring, MD;  Location: [facility];  Service: Urology;  Laterality: N/A;   TOTAL HIP ARTHROPLASTY Right 01/10/2018   Procedure: RIGHT TOTAL HIP ARTHROPLASTY ANTERIOR APPROACH;  Surgeon: Paralee Cancel, MD;  Location: [facility];  Service: Orthopedics;  Laterality: Right;  70 mins   TOTAL HIP ARTHROPLASTY Left 09/22/2021   Procedure: TOTAL HIP ARTHROPLASTY ANTERIOR APPROACH;  Surgeon: Paralee Cancel, MD;  Location: [facility];  Service: Orthopedics;  Laterality: Left;    Social History:  reports that he has never smoked. He has never used smokeless tobacco. He reports current alcohol use. He reports that he does not use drugs.  Allergies:  Allergies  Allergen Reactions   Multihance [Gadobenate] Nausea And Vomiting    Within a minute of receiving MRI contrast the patient projectile vomited for several minutes//he had just eaten lunch prior to scan.     Amoxicillin Rash    Has patient had a PCN reaction causing immediate rash, facial/tongue/throat swelling, SOB or lightheadedness with hypotension: Yes Has patient had a PCN reaction causing severe rash involving  mucus membranes or skin necrosis: No Has patient had a PCN reaction that required hospitalization: No Has patient had a PCN reaction occurring within the last 10 years: No If all of the above answers are "NO", then may proceed with Cephalosporin use.     (Not in a hospital admission)    Physical Exam: Blood pressure 119/76, pulse 74, temperature 98.7 F (37.1 C), resp. rate 12, height 6' 1.5" (1.867 m), weight 100.7 kg, SpO2 92  %. PE: General: pleasant, WD, male who is laying in bed in NAD HEENT: head is normocephalic, atraumatic.  Sclera are noninjected.  Pupils equal and round. EOMs intact.  Ears and nose without any masses or lesions.  Mouth is pink and moist Heart: regular, rate, and rhythm. Palpable radial pulse Lungs: CTAB, no wheezes, rhonchi, or rales noted.  Respiratory effort nonlabored Abd: soft, ND, no masses, or organomegaly. Small soft reducible umbilical hernia. Mild focal TTP in RUQ without rebound or guarding MSK: all 4 extremities are symmetrical with no cyanosis, clubbing, or edema. Calves nontender non edematous Skin: warm and dry with no masses, lesions, or rashes Neuro: Cranial nerves 2-12 grossly intact, sensation is normal throughout Psych: A&Ox3 with an appropriate affect.    Results for orders placed or performed during the hospital encounter of 09/29/22 (from the past 48 hour(s))  CBC with Differential     Status: Abnormal   Collection Time: 09/29/22 11:49 AM  Result Value Ref Range   WBC 10.3 4.0 - 10.5 K/uL   RBC 5.47 4.22 - 5.81 MIL/uL   Hemoglobin 17.0 13.0 - 17.0 g/dL   HCT 49.4 39.0 - 52.0 %   MCV 90.3 80.0 - 100.0 fL   MCH 31.1 26.0 - 34.0 pg   MCHC 34.4 30.0 - 36.0 g/dL   RDW 13.2 11.5 - 15.5 %   Platelets 174 150 - 400 K/uL   nRBC 0.0 0.0 - 0.2 %   Neutrophils Relative % 85 %   Neutro Abs 8.8 (H) 1.7 - 7.7 K/uL   Lymphocytes Relative 8 %   Lymphs Abs 0.8 0.7 - 4.0 K/uL   Monocytes Relative 6 %   Monocytes Absolute 0.6 0.1 - 1.0 K/uL   Eosinophils Relative 1 %   Eosinophils Absolute 0.1 0.0 - 0.5 K/uL   Basophils Relative 0 %   Basophils Absolute 0.0 0.0 - 0.1 K/uL   Immature Granulocytes 0 %   Abs Immature Granulocytes 0.02 0.00 - 0.07 K/uL    Comment: Performed at [facility], [place], [place] [address]  Comprehensive metabolic panel     Status: Abnormal   Collection Time: 09/29/22 11:49 AM  Result Value Ref Range   Sodium  140 135 - 145 mmol/L   Potassium 3.4 (L) 3.5 - 5.1 mmol/L   Chloride 103 98 - 111 mmol/L   CO2 24 22 - 32 mmol/L   Glucose, Bld 147 (H) 70 - 99 mg/dL    Comment: Glucose reference range applies only to samples taken after fasting for at least 8 hours.   BUN 34 (H) 8 - 23 mg/dL   Creatinine, Ser 1.58 (H) 0.61 - 1.24 mg/dL   Calcium 9.7 8.9 - 10.3 mg/dL   Total Protein 8.1 6.5 - 8.1 g/dL   Albumin 4.0 3.5 - 5.0 g/dL   AST 12 (L) 15 - 41 U/L   ALT 18 0 - 44 U/L   Alkaline Phosphatase 63 38 - 126 U/L   Total Bilirubin 1.4 (H)  0.3 - 1.2 mg/dL   GFR, Estimated 46 (L) >60 mL/min    Comment: (NOTE) Calculated using the CKD-EPI Creatinine Equation (2021)    Anion gap 13 5 - 15    Comment: Performed at [facility], [place], [place] [address]  Lipase, blood     Status: None   Collection Time: 09/29/22 11:49 AM  Result Value Ref Range   Lipase 13 11 - 51 U/L    Comment: Performed at [facility], [place], [place] [address]  Urinalysis, Routine w reflex microscopic -Urine, Clean Catch     Status: Abnormal   Collection Time: 09/29/22 11:49 AM  Result Value Ref Range   Color, Urine YELLOW YELLOW   APPearance CLEAR CLEAR   Specific Gravity, Urine 1.030 1.005 - 1.030   pH 5.5 5.0 - 8.0   Glucose, UA NEGATIVE NEGATIVE mg/dL   Hgb urine dipstick SMALL (A) NEGATIVE   Bilirubin Urine NEGATIVE NEGATIVE   Ketones, ur NEGATIVE NEGATIVE mg/dL   Protein, ur 30 (A) NEGATIVE mg/dL   Nitrite NEGATIVE NEGATIVE   Leukocytes,Ua NEGATIVE NEGATIVE   RBC / HPF 0-5 0 - 5 RBC/hpf   WBC, UA 11-20 0 - 5 WBC/hpf   Bacteria, UA RARE (A) NONE SEEN   Squamous Epithelial / HPF 0-5 0 - 5 /HPF   Mucus PRESENT    Hyaline Casts, UA PRESENT     Comment: Performed at [facility], [address], [place], [place] [address]   US Abdomen Limited RUQ (LIVER/GB)  Result Date: 09/29/2022 CLINICAL DATA:  Right upper quadrant  abdominal pain EXAM: ULTRASOUND ABDOMEN LIMITED RIGHT UPPER QUADRANT COMPARISON:  09/28/2022 FINDINGS: Gallbladder: Gallstones, sludge, and gallbladder wall thickening up to 0.6 cm. Small volume pericholecystic fluid. No sonographic Murphy sign noted by sonographer. Common bile duct: Diameter: Common bile duct measures up to 0.7 cm Liver: No focal lesion identified. Increased parenchymal echogenicity. Portal vein is patent on color Doppler imaging with normal direction of blood flow towards the liver. Other: Trace perihepatic ascites. IMPRESSION: 1. Gallstones, sludge, and gallbladder wall thickening. Small volume pericholecystic fluid. Negative sonographic Murphy sign. Findings are similar to previous day's examination and again concerning for acute cholecystitis. 2. Common bile duct measures up to 0.7 cm, upper limits of normal. 3. Hepatic steatosis. 4. Trace perihepatic ascites. Electronically Signed   By: Delanna Ahmadi M.D.   On: 09/29/2022 13:54   US Abdomen Complete  Result Date: 09/28/2022 CLINICAL DATA:  Epigastric pain EXAM: ABDOMEN ULTRASOUND COMPLETE COMPARISON:  Abdominal ultrasound dated November 10, 2020. FINDINGS: Gallbladder: Marked gallbladder wall thickening. Gallstones visualized, largest measures 9 mm. No sonographic Murphy sign noted by sonographer. Common bile duct: Diameter: 5.6 mm Liver: No focal lesion identified. Increased parenchymal echogenicity. Portal vein is patent on color Doppler imaging with normal direction of blood flow towards the liver. IVC: No abnormality visualized. Pancreas: Not visualized due to overlying bowel gas. Spleen: Size and appearance within normal limits. Right Kidney: Length: 2.7. Echogenicity within normal limits. No mass or hydronephrosis visualized. Left Kidney: Length: 11.0. Echogenicity within normal limits. Simple appearing cyst of the lower pole measuring 1.3 cm. No mass or hydronephrosis visualized. Abdominal aorta: No aneurysm visualized. Other findings:  Trace perihepatic fluid. IMPRESSION: 1. Gallbladder wall thickening with cholelithiasis. Findings are suggestive of acute cholecystitis, although sonographic Murphy sign is negative. Gallbladder wall thickening could also be secondary to systemic process. Recommend clinical correlation. 2. Hepatic steatosis. 3. Trace perihepatic fluid. Electronically Signed  By: Yetta Glassman M.D.   On: 09/28/2022 16:57      Assessment/Plan Cholelithiasis, possible cholecystitis Patient seen and examined and relevant labs and imaging reviewed. Findings and exam consistent with symptomatic cholelithiasis. T bili up slightly at 1.4. Recommend definitive treatment with laparoscopic cholecystectomy. Will admit to observation, abx and IV fluids and plan for lap chole IOC tomorrow.  AKI-  Cr 1.5 from 1.8 yesterday, IVF  FEN - clears, NPO midnight, hypokalemia - IVF with potassium VTE - lovenox ID - [email or web address]  I reviewed ED provider notes, last 24 h vitals and pain scores, last 48 h intake and output, last 24 h labs and trends, and last 24 h imaging results.  Jene Miu, [facility] 09/29/2022, 3:04 PM Please see Amion for pager number during day hours 7:00am-4:30pm or 7:00am -11:30am on weekends

## 2022-09-29 NOTE — ED Notes (Signed)
Report called to RN upstairs to inform of patients transfer.

## 2022-09-29 NOTE — ED Notes (Signed)
Patient updated on the status of his room.

## 2022-09-30 ENCOUNTER — Encounter (HOSPITAL_COMMUNITY): Payer: Self-pay

## 2022-09-30 ENCOUNTER — Observation Stay (HOSPITAL_COMMUNITY): Payer: Medicare PPO | Admitting: Anesthesiology

## 2022-09-30 ENCOUNTER — Other Ambulatory Visit: Payer: Self-pay

## 2022-09-30 ENCOUNTER — Encounter (HOSPITAL_COMMUNITY)
Admission: EM | Disposition: A | Discharge status: Home / Self Care | Payer: Self-pay | Source: Home / Self Care | Attending: Emergency Medicine

## 2022-09-30 ENCOUNTER — Ambulatory Visit (HOSPITAL_COMMUNITY): Payer: Medicare PPO | Attending: Family Medicine

## 2022-09-30 ENCOUNTER — Observation Stay (HOSPITAL_BASED_OUTPATIENT_CLINIC_OR_DEPARTMENT_OTHER): Payer: Medicare PPO | Admitting: Anesthesiology

## 2022-09-30 ENCOUNTER — Observation Stay (HOSPITAL_COMMUNITY): Payer: Medicare PPO

## 2022-09-30 DIAGNOSIS — K8000 Calculus of gallbladder with acute cholecystitis without obstruction: Secondary | ICD-10-CM | POA: Diagnosis not present

## 2022-09-30 HISTORY — PX: CHOLECYSTECTOMY: SHX55

## 2022-09-30 LAB — CBC
HCT: 45.4 % (ref 39.0–52.0)
Hemoglobin: 15.3 g/dL (ref 13.0–17.0)
MCH: 31.2 pg (ref 26.0–34.0)
MCHC: 33.7 g/dL (ref 30.0–36.0)
MCV: 92.5 fL (ref 80.0–100.0)
Platelets: 175 10*3/uL (ref 150–400)
RBC: 4.91 MIL/uL (ref 4.22–5.81)
RDW: 13.2 % (ref 11.5–15.5)
WBC: 10 10*3/uL (ref 4.0–10.5)
nRBC: 0 % (ref 0.0–0.2)

## 2022-09-30 LAB — COMPREHENSIVE METABOLIC PANEL
ALT: 20 U/L (ref 0–44)
AST: 20 U/L (ref 15–41)
Albumin: 2.7 g/dL — ABNORMAL LOW (ref 3.5–5.0)
Alkaline Phosphatase: 59 U/L (ref 38–126)
Anion gap: 11 (ref 5–15)
BUN: 26 mg/dL — ABNORMAL HIGH (ref 8–23)
CO2: 24 mmol/L (ref 22–32)
Calcium: 8.5 mg/dL — ABNORMAL LOW (ref 8.9–10.3)
Chloride: 103 mmol/L (ref 98–111)
Creatinine, Ser: 1.53 mg/dL — ABNORMAL HIGH (ref 0.61–1.24)
GFR, Estimated: 47 mL/min — ABNORMAL LOW (ref >60)
Glucose, Bld: 122 mg/dL — ABNORMAL HIGH (ref 70–99)
Potassium: 3.4 mmol/L — ABNORMAL LOW (ref 3.5–5.1)
Sodium: 138 mmol/L (ref 135–145)
Total Bilirubin: 1 mg/dL (ref 0.3–1.2)
Total Protein: 6.4 g/dL — ABNORMAL LOW (ref 6.5–8.1)

## 2022-09-30 SURGERY — LAPAROSCOPIC CHOLECYSTECTOMY WITH INTRAOPERATIVE CHOLANGIOGRAM
Anesthesia: General | Site: Abdomen

## 2022-09-30 MED ORDER — LACTATED RINGERS IV SOLN: INTRAVENOUS | Status: DC | PRN

## 2022-09-30 MED ORDER — 0.9 % SODIUM CHLORIDE (POUR BTL) OPTIME
TOPICAL | Status: DC | PRN
  Administered 2022-09-30: 1000 mL

## 2022-09-30 MED ORDER — LIDOCAINE 2% (20 MG/ML) 5 ML SYRINGE
INTRAMUSCULAR | Status: DC | PRN
  Administered 2022-09-30: 100 mg via INTRAVENOUS

## 2022-09-30 MED ORDER — BUPIVACAINE-EPINEPHRINE 0.25% -1:200000 IJ SOLN
INTRAMUSCULAR | Status: DC | PRN
  Administered 2022-09-30: 23 mL

## 2022-09-30 MED ORDER — SUGAMMADEX SODIUM 200 MG/2ML IV SOLN
INTRAVENOUS | Status: DC | PRN
  Administered 2022-09-30: 200 mg via INTRAVENOUS

## 2022-09-30 MED ORDER — FENTANYL CITRATE (PF) 100 MCG/2ML IJ SOLN: 25.0000 ug | INTRAMUSCULAR | Status: DC | PRN

## 2022-09-30 MED ORDER — MORPHINE SULFATE (PF) 2 MG/ML IV SOLN: 1.0000 mg | INTRAVENOUS | Status: DC | PRN

## 2022-09-30 MED ORDER — AMISULPRIDE (ANTIEMETIC) 5 MG/2ML IV SOLN: 10.0000 mg | Freq: Once | INTRAVENOUS | Status: DC | PRN

## 2022-09-30 MED ORDER — DEXAMETHASONE SODIUM PHOSPHATE 10 MG/ML IJ SOLN
INTRAMUSCULAR | Status: DC | PRN
  Administered 2022-09-30: 10 mg via INTRAVENOUS

## 2022-09-30 MED ORDER — PROPOFOL 10 MG/ML IV BOLUS
INTRAVENOUS | Status: DC | PRN
  Administered 2022-09-30: 150 mg via INTRAVENOUS

## 2022-09-30 MED ORDER — OXYCODONE HCL 5 MG PO TABS: 5.0000 mg | ORAL_TABLET | Freq: Once | ORAL | Status: DC | PRN

## 2022-09-30 MED ORDER — OXYCODONE HCL 5 MG/5ML PO SOLN: 5.0000 mg | Freq: Once | ORAL | Status: DC | PRN

## 2022-09-30 MED ORDER — FENTANYL CITRATE (PF) 250 MCG/5ML IJ SOLN
INTRAMUSCULAR | Status: DC | PRN
  Administered 2022-09-30: 50 ug via INTRAVENOUS
  Administered 2022-09-30: 100 ug via INTRAVENOUS
  Administered 2022-09-30 (×2): 50 ug via INTRAVENOUS

## 2022-09-30 MED ORDER — FENTANYL CITRATE (PF) 250 MCG/5ML IJ SOLN
INTRAMUSCULAR | Status: AC
  Filled 2022-09-30: qty 5

## 2022-09-30 MED ORDER — ONDANSETRON HCL 4 MG/2ML IJ SOLN
INTRAMUSCULAR | Status: DC | PRN
  Administered 2022-09-30: 4 mg via INTRAVENOUS

## 2022-09-30 MED ORDER — ENOXAPARIN SODIUM 40 MG/0.4ML IJ SOSY
40.0000 mg | PREFILLED_SYRINGE | INTRAMUSCULAR | Status: DC
  Administered 2022-10-01: 40 mg via SUBCUTANEOUS
  Filled 2022-09-30: qty 0.4

## 2022-09-30 MED ORDER — ROCURONIUM BROMIDE 10 MG/ML (PF) SYRINGE
PREFILLED_SYRINGE | INTRAVENOUS | Status: DC | PRN
  Administered 2022-09-30: 60 mg via INTRAVENOUS
  Administered 2022-09-30: 10 mg via INTRAVENOUS

## 2022-09-30 MED ORDER — PROPOFOL 10 MG/ML IV BOLUS
INTRAVENOUS | Status: AC
  Filled 2022-09-30: qty 20

## 2022-09-30 MED ORDER — BUPIVACAINE HCL (PF) 0.25 % IJ SOLN
INTRAMUSCULAR | Status: AC
  Filled 2022-09-30: qty 30

## 2022-09-30 MED ORDER — SODIUM CHLORIDE 0.9 % IR SOLN
Status: DC | PRN
  Administered 2022-09-30: 1000 mL

## 2022-09-30 SURGICAL SUPPLY — 40 items
APPLIER CLIP 5 13 M/L LIGAMAX5 (MISCELLANEOUS) ×1
BAG COUNTER SPONGE SURGICOUNT (BAG) ×1 IMPLANT
BIOPATCH RED 1 DISK 7.0 (GAUZE/BANDAGES/DRESSINGS) IMPLANT
CANISTER SUCT 3000ML PPV (MISCELLANEOUS) ×1 IMPLANT
CHLORAPREP W/TINT 26 (MISCELLANEOUS) ×1 IMPLANT
CLIP APPLIE 5 13 M/L LIGAMAX5 (MISCELLANEOUS) ×1 IMPLANT
COVER MAYO STAND STRL (DRAPES) ×1 IMPLANT
COVER SURGICAL LIGHT HANDLE (MISCELLANEOUS) ×1 IMPLANT
DERMABOND ADVANCED .7 DNX12 (GAUZE/BANDAGES/DRESSINGS) ×1 IMPLANT
DRAIN CHANNEL 19F RND (DRAIN) IMPLANT
DRAPE C-ARM 42X120 X-RAY (DRAPES) ×1 IMPLANT
DRSG TEGADERM 4X4.75 (GAUZE/BANDAGES/DRESSINGS) IMPLANT
ELECT REM PT RETURN 9FT ADLT (ELECTROSURGICAL) ×1
ELECTRODE REM PT RTRN 9FT ADLT (ELECTROSURGICAL) ×1 IMPLANT
EVACUATOR SILICONE 100CC (DRAIN) IMPLANT
GLOVE BIO SURGEON STRL SZ7.5 (GLOVE) ×1 IMPLANT
GOWN STRL REUS W/ TWL LRG LVL3 (GOWN DISPOSABLE) ×3 IMPLANT
GOWN STRL REUS W/TWL LRG LVL3 (GOWN DISPOSABLE) ×3
IV CATH 14GX2 1/4 (CATHETERS) ×1 IMPLANT
KIT BASIN OR (CUSTOM PROCEDURE TRAY) ×1 IMPLANT
KIT TURNOVER KIT B (KITS) ×1 IMPLANT
NS IRRIG 1000ML POUR BTL (IV SOLUTION) ×1 IMPLANT
PAD ARMBOARD 7.5X6 YLW CONV (MISCELLANEOUS) ×1 IMPLANT
SCISSORS LAP 5X35 DISP (ENDOMECHANICALS) ×1 IMPLANT
SET IRRIG TUBING LAPAROSCOPIC (IRRIGATION / IRRIGATOR) ×1 IMPLANT
SET TUBE SMOKE EVAC HIGH FLOW (TUBING) ×1 IMPLANT
SLEEVE Z-THREAD 5X100MM (TROCAR) ×2 IMPLANT
SPECIMEN JAR SMALL (MISCELLANEOUS) ×1 IMPLANT
SUT ETHILON 2 0 FS 18 (SUTURE) IMPLANT
SUT MNCRL AB 4-0 PS2 18 (SUTURE) ×1 IMPLANT
SUT VICRYL 0 UR6 27IN ABS (SUTURE) IMPLANT
SUT VLOC 180 2-0 6IN GS21 (SUTURE) IMPLANT
SYS BAG RETRIEVAL 10MM (BASKET) ×1
SYSTEM BAG RETRIEVAL 10MM (BASKET) ×1 IMPLANT
TOWEL GREEN STERILE (TOWEL DISPOSABLE) ×1 IMPLANT
TRAY LAPAROSCOPIC MC (CUSTOM PROCEDURE TRAY) ×1 IMPLANT
TROCAR BALLN 12MMX100 BLUNT (TROCAR) ×1 IMPLANT
TROCAR Z-THREAD OPTICAL 5X100M (TROCAR) ×1 IMPLANT
WARMER LAPAROSCOPE (MISCELLANEOUS) IMPLANT
WATER STERILE IRR 1000ML POUR (IV SOLUTION) ×1 IMPLANT

## 2022-09-30 NOTE — Plan of Care (Signed)

## 2022-09-30 NOTE — Interval H&P Note (Signed)
History and Physical Interval Note:  09/30/2022 7:31 AM  Fred Thornton  has presented today for surgery, with the diagnosis of Acute Cholelithiasis.  The various methods of treatment have been discussed with the patient and family. After consideration of risks, benefits and other options for treatment, the patient has consented to  Procedure(s): LAPAROSCOPIC CHOLECYSTECTOMY WITH INTRAOPERATIVE CHOLANGIOGRAM (N/A) as a surgical intervention.  The patient's history has been reviewed, patient examined, no change in status, stable for surgery.  I have reviewed the patient's chart and labs.  Questions were answered to the patient's satisfaction.     Autumn Messing III

## 2022-09-30 NOTE — Op Note (Signed)
09/30/2022  9:42 AM  PATIENT:  Fred Thornton  77 y.o. male  PRE-OPERATIVE DIAGNOSIS:  Acute Cholecystitis with colelithiasis  POST-OPERATIVE DIAGNOSIS:   Acute Cholecystitis with colelithiasis  PROCEDURE:  Procedure(s): LAPAROSCOPIC SUBTOTAL CHOLECYSTECTOMY (N/A)  SURGEON:  Surgeon(s) and Role:    * Jovita Kussmaul, MD - Primary    * Cornett, Marcello Moores, MD - Assisting  PHYSICIAN ASSISTANT:   ASSISTANTS: Dr. Brantley Stage   ANESTHESIA:   local and general  EBL:  50 mL   BLOOD ADMINISTERED:none  DRAINS: (1) Blake drain(s) in the gallbladder fossa of the liver    LOCAL MEDICATIONS USED:  MARCAINE     SPECIMEN:  Source of Specimen:  gallbladder  DISPOSITION OF SPECIMEN:  PATHOLOGY  COUNTS:  YES  TOURNIQUET:  * No tourniquets in log *  DICTATION: .Dragon Dictation  After informed consent was obtained the patient was brought to the operating room and placed in the supine position on the operating table.  After adequate induction of general anesthesia the patient's abdomen was prepped with ChloraPrep, allowed to dry, and draped in usual sterile manner.  An appropriate timeout was performed.  The area above the umbilicus was infiltrated with quarter percent Marcaine.  A small vertically oriented incision was made with a 15 blade knife.  The incision was carried through the subcutaneous tissue bluntly with a hemostat and [employer] retractors into the linea alba was identified.  The linea alba was incised with the 15 blade knife and each site was grasped with Kocher clamps and elevated anteriorly.  The fascia was very thinned out with some Swiss cheeselike defects from a hernia likely related to his previous prostate surgery.  A 0 Vicryl pursestring stitch was placed in the fascia around the opening.  A Hossein cannula was placed through the opening and anchored in place with the Vicryl pursestring stitch.  The abdomen was then insufflated with carbon dioxide without difficulty.  The  laparoscope was inserted through the [facility] cannula and the abdominal cavity was inspected.  There were omental adhesions around the supraumbilical port site and inferiorly on the abdominal wall.  The upper abdomen was fairly free.  Next the epigastric region was infiltrated with quarter percent Marcaine.  A small stab incision was made with a 15 blade knife.  A 5 mm port was placed bluntly through this incision into the abdominal cavity under direct vision.  Two 5 mm ports were then placed in a similar manner in the right upper quadrant.  The omentum was stuck to the body of the gallbladder fairly densely.  It became apparent as the omentum was bluntly dissected away from the body of the gallbladder that some of the gallbladder wall was necrotic and had likely perforated and then been walled off by the omentum.  The dome of the gallbladder was then grasped with a blunt grasper and elevated anteriorly and superiorly.  The base of the gallbladder was severely inflamed and thickened to the point where we could not safely dissecting this area and identify any meaningful structures.  I then elected to separate the gallbladder from the liver using a top-down technique.  In doing so I had to leave some of the back wall of the gallbladder attached to the liver.  Once the dissection was carried down far enough that the gallbladder started to narrow to become the cystic duct I then divided the gallbladder at that point.  A laparoscopic bag was inserted through the [facility] cannula and the gallbladder was placed  within the bag.  The bag and gallbladder were then removed through the supraumbilical port without difficulty.  The abdomen was then irrigated with copious amounts of saline.  I was able to then closed some of the stump of the gallbladder neck with a 3-0 V-Loc stitch.  Next a 1 [place] round Blake drain was brought into the abdominal cavity and out the lateralmost 5 mm port site.  The drain was anchored to the skin with  a 3-0 nylon stitch.  The drain was placed along the gallbladder fossa of the liver.  The area was examined again and found to be hemostatic.  The remaining mucosa of the posterior wall of the liver was fulgurated with the cautery.  At this point the [facility] cannula was removed.  The fascial defect and umbilical hernia were closed as well as could be using interrupted 0 Vicryl figure-of-eight stitches.  The rest of the ports were then removed under direct vision and the gas was allowed to escape.  The drain was placed to bulb suction and there was a good seal.  The skin incisions were then closed with interrupted 4-0 Monocryl subcuticular stitches.  Dermabond dressings and sterile drain dressings were applied.  The patient tolerated the procedure well.  At the end of the case all needle sponge and instrument counts were correct.  The patient was then awakened and taken to recovery in stable condition.  PLAN OF CARE: Admit to inpatient   PATIENT DISPOSITION:  PACU - hemodynamically stable.   Delay start of Pharmacological VTE agent (>24hrs) due to surgical blood loss or risk of bleeding: no

## 2022-09-30 NOTE — Transfer of Care (Signed)
Immediate Anesthesia Transfer of Care Note  Patient: Fred Thornton  Procedure(s) Performed: LAPAROSCOPIC CHOLECYSTECTOMY (Abdomen)  Patient Location: PACU  Anesthesia Type:General  Level of Consciousness: drowsy and patient cooperative  Airway & Oxygen Therapy: Patient Spontanous Breathing and Patient connected to nasal cannula oxygen  Post-op Assessment: Report given to RN and Post -op Vital signs reviewed and stable  Post vital signs: Reviewed and stable  Last Vitals:  Vitals Value Taken Time  BP 147/87 09/30/22 0951  Temp    Pulse 73 09/30/22 0955  Resp 18 09/30/22 0955  SpO2 90 % 09/30/22 0955  Vitals shown include unvalidated device data.  Last Pain:  Vitals:   09/30/22 0550  TempSrc: Oral  PainSc: 0-No pain         Complications: No notable events documented.

## 2022-09-30 NOTE — Progress Notes (Signed)
Handoff given to OR (short stay Charge Nurse).

## 2022-09-30 NOTE — Anesthesia Postprocedure Evaluation (Signed)
Anesthesia Post Note  Patient: Fred Thornton  Procedure(s) Performed: LAPAROSCOPIC CHOLECYSTECTOMY (Abdomen)     Patient location during evaluation: PACU Anesthesia Type: General Level of consciousness: awake Pain management: pain level controlled Vital Signs Assessment: post-procedure vital signs reviewed and stable Respiratory status: spontaneous breathing, nonlabored ventilation and respiratory function stable Cardiovascular status: blood pressure returned to baseline and stable Postop Assessment: no apparent nausea or vomiting Anesthetic complications: no   No notable events documented.  Last Vitals:  Vitals:   09/30/22 1033 09/30/22 1034  BP: (!) 143/81   Pulse: 71 69  Resp: 16 19  Temp: 36.8 C   SpO2: (!) 89% 92%    Last Pain:  Vitals:   09/30/22 1033  TempSrc: Oral  PainSc:                  Nilda Simmer

## 2022-09-30 NOTE — Anesthesia Procedure Notes (Signed)
Procedure Name: Intubation Date/Time: 09/30/2022 8:00 AM  Performed by: Minerva Ends, CRNAPre-anesthesia Checklist: Patient identified, Emergency Drugs available, Suction available and Patient being monitored Patient Re-evaluated:Patient Re-evaluated prior to induction Oxygen Delivery Method: Circle system utilized Preoxygenation: Pre-oxygenation with 100% oxygen Induction Type: IV induction Ventilation: Mask ventilation without difficulty Laryngoscope Size: Mac and 3 Grade View: Grade I Tube type: Oral Tube size: 7.0 mm Number of attempts: 1 Airway Equipment and Method: Stylet and Oral airway Placement Confirmation: ETT inserted through vocal cords under direct vision, positive ETCO2 and breath sounds checked- equal and bilateral Secured at: 23 cm Tube secured with: Tape Dental Injury: Teeth and Oropharynx as per pre-operative assessment

## 2022-10-01 ENCOUNTER — Encounter (HOSPITAL_COMMUNITY): Payer: Self-pay | Admitting: General Surgery

## 2022-10-01 LAB — BASIC METABOLIC PANEL
Anion gap: 7 (ref 5–15)
BUN: 22 mg/dL (ref 8–23)
CO2: 23 mmol/L (ref 22–32)
Calcium: 8.5 mg/dL — ABNORMAL LOW (ref 8.9–10.3)
Chloride: 108 mmol/L (ref 98–111)
Creatinine, Ser: 1.47 mg/dL — ABNORMAL HIGH (ref 0.61–1.24)
GFR, Estimated: 50 mL/min — ABNORMAL LOW (ref >60)
Glucose, Bld: 140 mg/dL — ABNORMAL HIGH (ref 70–99)
Potassium: 3.9 mmol/L (ref 3.5–5.1)
Sodium: 138 mmol/L (ref 135–145)

## 2022-10-01 LAB — SURGICAL PATHOLOGY

## 2022-10-01 MED ORDER — TRAMADOL HCL 50 MG PO TABS: 50.0000 mg | ORAL_TABLET | Freq: Four times a day (QID) | ORAL | Status: DC | PRN

## 2022-10-01 MED ORDER — TRAMADOL HCL 50 MG PO TABS: 50.0000 mg | ORAL_TABLET | Freq: Four times a day (QID) | ORAL | 0 refills | Status: AC | PRN

## 2022-10-01 MED ORDER — ACETAMINOPHEN 500 MG PO TABS: 1000.0000 mg | ORAL_TABLET | Freq: Four times a day (QID) | ORAL | 0 refills | Status: AC | PRN

## 2022-10-01 MED ORDER — ACETAMINOPHEN 500 MG PO TABS: 1000.0000 mg | ORAL_TABLET | Freq: Four times a day (QID) | ORAL | Status: DC | PRN

## 2022-10-01 NOTE — Progress Notes (Signed)
Progress Note  1 Day Post-Op  Subjective: CC: having abdominal pain around incisions and has not been taking pain medications. Passing flatus. No n/v   Objective: Vital signs in last 24 hours: Temp:  [97.4 F (36.3 C)-98.4 F (36.9 C)] 98.3 F (36.8 C) (02/02 0515) Pulse Rate:  [64-74] 64 (02/02 0515) Resp:  [16-19] 17 (02/02 0515) BP: (127-148)/(73-92) 127/79 (02/02 0515) SpO2:  [89 %-94 %] 93 % (02/02 0515) Last BM Date : 09/29/22  Intake/Output from previous day: 02/01 0701 - 02/02 0700 In: 2153.8 [I.V.:2053.8; IV Piggyback:100] Out: 80 [Drains:30; Blood:50] Intake/Output this shift: No intake/output data recorded.  PE: General: pleasant, WD, male who is laying in bed in NAD Lungs: Respiratory effort nonlabored on room air Abd: soft, ND, appropriately TTP around incisions and drain. Incisions c/d/I. Drain with 30 ml/24 hr SS fluid documented MSK: all 4 extremities are symmetrical with no cyanosis, clubbing, or edema. Skin: warm and dry Psych: A&Ox3 with an appropriate affect.    Lab Results:  Recent Labs    09/29/22 1149 09/30/22 0418  WBC 10.3 10.0  HGB 17.0 15.3  HCT 49.4 45.4  PLT 174 175   BMET Recent Labs    09/30/22 0418 10/01/22 0555  NA 138 138  K 3.4* 3.9  CL 103 108  CO2 24 23  GLUCOSE 122* 140*  BUN 26* 22  CREATININE 1.53* 1.47*  CALCIUM 8.5* 8.5*   PT/INR No results for input(s): "LABPROT", "INR" in the last 72 hours. CMP     Component Value Date/Time   NA 138 10/01/2022 0555   K 3.9 10/01/2022 0555   CL 108 10/01/2022 0555   CO2 23 10/01/2022 0555   GLUCOSE 140 (H) 10/01/2022 0555   BUN 22 10/01/2022 0555   CREATININE 1.47 (H) 10/01/2022 0555   CALCIUM 8.5 (L) 10/01/2022 0555   PROT 6.4 (L) 09/30/2022 0418   ALBUMIN 2.7 (L) 09/30/2022 0418   AST 20 09/30/2022 0418   ALT 20 09/30/2022 0418   ALKPHOS 59 09/30/2022 0418   BILITOT 1.0 09/30/2022 0418   GFRNONAA 50 (L) 10/01/2022 0555   GFRAA >60 01/11/2018 0525   Lipase      Component Value Date/Time   LIPASE 13 09/29/2022 1149       Studies/Results: US Abdomen Limited RUQ (LIVER/GB)  Result Date: 09/29/2022 CLINICAL DATA:  Right upper quadrant abdominal pain EXAM: ULTRASOUND ABDOMEN LIMITED RIGHT UPPER QUADRANT COMPARISON:  09/28/2022 FINDINGS: Gallbladder: Gallstones, sludge, and gallbladder wall thickening up to 0.6 cm. Small volume pericholecystic fluid. No sonographic Murphy sign noted by sonographer. Common bile duct: Diameter: Common bile duct measures up to 0.7 cm Liver: No focal lesion identified. Increased parenchymal echogenicity. Portal vein is patent on color Doppler imaging with normal direction of blood flow towards the liver. Other: Trace perihepatic ascites. IMPRESSION: 1. Gallstones, sludge, and gallbladder wall thickening. Small volume pericholecystic fluid. Negative sonographic Murphy sign. Findings are similar to previous day's examination and again concerning for acute cholecystitis. 2. Common bile duct measures up to 0.7 cm, upper limits of normal. 3. Hepatic steatosis. 4. Trace perihepatic ascites. Electronically Signed   By: Delanna Ahmadi M.D.   On: 09/29/2022 13:54    Anti-infectives: Anti-infectives (From admission, onward)    Start     Dose/Rate Route Frequency Ordered Stop   09/29/22 1800  cefTRIAXone (ROCEPHIN) 2 g in sodium chloride 0.9 % 100 mL IVPB  Status:  Discontinued        2 g 200 mL/hr over  30 Minutes Intravenous Every 24 hours 09/29/22 1641 09/29/22 1646   09/29/22 1730  piperacillin-tazobactam (ZOSYN) IVPB 3.375 g        3.375 g 12.5 mL/hr over 240 Minutes Intravenous Every 8 hours 09/29/22 1709     09/29/22 1700  piperacillin-tazobactam (ZOSYN) IVPB 3.375 g  Status:  Discontinued        3.375 g 100 mL/hr over 30 Minutes Intravenous Every 8 hours 09/29/22 1646 09/29/22 1703        Assessment/Plan   Acute Cholecystitis with colelithiasis  POD1 s/p lap subtotal cholecystectomy 2/1 Dr. Marlou Starks - continue diet this  am - continue drain - currently SS - add tramadol for pain control  Possible dc this afternoon   FEN: soft ID: [email or web address] VTE: lovenox     LOS: 0 days   Winferd Humphrey, [facility] 10/01/2022, 8:38 AM Please see Amion for pager number during day hours 7:00am-4:30pm

## 2022-10-01 NOTE — Plan of Care (Signed)

## 2022-10-01 NOTE — Care Management Obs Status (Signed)
[place] NOTIFICATION   Patient Details  Name: Fred Thornton MRN: [id number] Date of Birth: 30-Dec-1945   Medicare Observation Status Notification Given:  Yes    Curlene Labrum, RN 10/01/2022, 1:23 PM

## 2022-10-01 NOTE — Plan of Care (Signed)
  Problem: Education: Goal: Knowledge of General Education information will improve Description: Including pain rating scale, medication(s)/side effects and non-pharmacologic comfort measures 10/01/2022 1252 by Flossie Dibble, RN Outcome: Completed/Met 10/01/2022 0753 by Flossie Dibble, RN Outcome: Progressing   Problem: Health Behavior/Discharge Planning: Goal: Ability to manage health-related needs will improve 10/01/2022 1252 by Flossie Dibble, RN Outcome: Completed/Met 10/01/2022 0753 by Flossie Dibble, RN Outcome: Progressing   Problem: Clinical Measurements: Goal: Ability to maintain clinical measurements within normal limits will improve 10/01/2022 1252 by Flossie Dibble, RN Outcome: Completed/Met 10/01/2022 0753 by Flossie Dibble, RN Outcome: Progressing Goal: Will remain free from infection 10/01/2022 1252 by Flossie Dibble, RN Outcome: Completed/Met 10/01/2022 0753 by Flossie Dibble, RN Outcome: Progressing Goal: Diagnostic test results will improve 10/01/2022 1252 by Flossie Dibble, RN Outcome: Completed/Met 10/01/2022 0753 by Flossie Dibble, RN Outcome: Progressing Goal: Respiratory complications will improve 10/01/2022 1252 by Flossie Dibble, RN Outcome: Completed/Met 10/01/2022 0753 by Flossie Dibble, RN Outcome: Progressing Goal: Cardiovascular complication will be avoided 10/01/2022 1252 by Flossie Dibble, RN Outcome: Completed/Met 10/01/2022 0753 by Flossie Dibble, RN Outcome: Progressing   Problem: Activity: Goal: Risk for activity intolerance will decrease 10/01/2022 1252 by Flossie Dibble, RN Outcome: Completed/Met 10/01/2022 0753 by Flossie Dibble, RN Outcome: Progressing   Problem: Nutrition: Goal: Adequate nutrition will be maintained 10/01/2022 1252 by Flossie Dibble, RN Outcome: Completed/Met 10/01/2022 0753 by Flossie Dibble, RN Outcome: Progressing   Problem: Coping: Goal: Level of anxiety will decrease 10/01/2022 1252 by Flossie Dibble, RN Outcome:  Completed/Met 10/01/2022 0753 by Flossie Dibble, RN Outcome: Progressing   Problem: Elimination: Goal: Will not experience complications related to bowel motility 10/01/2022 1252 by Flossie Dibble, RN Outcome: Completed/Met 10/01/2022 0753 by Flossie Dibble, RN Outcome: Progressing Goal: Will not experience complications related to urinary retention 10/01/2022 1252 by Flossie Dibble, RN Outcome: Completed/Met 10/01/2022 0753 by Flossie Dibble, RN Outcome: Progressing   Problem: Pain Managment: Goal: General experience of comfort will improve 10/01/2022 1252 by Flossie Dibble, RN Outcome: Completed/Met 10/01/2022 0753 by Flossie Dibble, RN Outcome: Progressing   Problem: Safety: Goal: Ability to remain free from injury will improve 10/01/2022 1252 by Flossie Dibble, RN Outcome: Completed/Met 10/01/2022 0753 by Flossie Dibble, RN Outcome: Progressing   Problem: Skin Integrity: Goal: Risk for impaired skin integrity will decrease 10/01/2022 1252 by Flossie Dibble, RN Outcome: Completed/Met 10/01/2022 0753 by Flossie Dibble, RN Outcome: Progressing

## 2022-10-01 NOTE — TOC Progression Note (Signed)
Transition of Care [facility]) - Progression Note    Patient Details  Name: Fred Thornton MRN: [id number] Date of Birth: [birth year]  Transition [facility]) CM/SW [place], RN Phone Number: 10/01/2022, 2:07 PM  Clinical Narrative:    CM met with the patient and wife at the bedside and discussed patient discharge to home.  The patient has no needs from [facility] for home.  The wife states she was provided with education regarding JP drain care relating to patient's S/P Lap cholecystectomy.  CM provided the patient with Medicare observation letter.          Expected Discharge Plan and Services         Expected Discharge Date: 10/01/22                                     Social Determinants of Health (SDOH) Interventions SDOH Screenings   Food Insecurity: No Food Insecurity (09/30/2022)  Housing: Low Risk  (09/30/2022)  Transportation Needs: No Transportation Needs (09/30/2022)  Utilities: Not At Risk (09/30/2022)  Tobacco Use: Low Risk  (10/01/2022)    Readmission Risk Interventions     No data to display

## 2022-10-05 NOTE — Discharge Summary (Signed)
[facility] Discharge Summary   Patient ID: TRACER PALMS MRN: [id number] DOB/AGE: 01-25-1946 77 y.o.  Admit date: 09/29/2022 Discharge date: 10/01/2022  Admitting Diagnosis: Acute cholecystitis [K81.0] Right upper quadrant pain [R10.11]   Discharge Diagnosis Acute cholecystitis  Right upper quadrant pain S/p laparoscopic subtotal cholecystectomy  Consultants None  Imaging: No results found.  Procedures Dr. Marlou Starks (09/30/22) -  laparoscopic subtotal cholecystectomy  Hospital Course:  77 y.o. male who presented to [facility] ED with abdominal pain.  Workup showed cholelithiasis, possible cholecystitis.  Patient was admitted and underwent procedure listed above with findings of cholecystitis.  Tolerated procedure well and was transferred to the floor.  Diet was advanced as tolerated.  On POD1, the patient was voiding well, tolerating diet, ambulating well, pain well controlled, vital signs stable, incisions c/d/i and felt stable for discharge home.  Patient will follow up in our office in 1 week and knows to call with questions or concerns. He was encouraged to follow up with his PCP as well to recheck his renal function.  I or a member of my team have reviewed this patient in the Controlled Substance Database.   Allergies as of 10/01/2022       Reactions   Multihance [gadobenate] Nausea And Vomiting   Within a minute of receiving MRI contrast the patient projectile vomited for several minutes//he had just eaten lunch prior to scan.     Amoxil [amoxicillin] Rash   Childhood allergy Has patient had a PCN reaction causing immediate rash, facial/tongue/throat swelling, SOB or lightheadedness with hypotension: Yes Has patient had a PCN reaction causing severe rash involving mucus membranes or skin necrosis: No Has patient had a PCN reaction that required hospitalization: No Has patient had a PCN reaction occurring within the last 10 years: No If all of the above answers  are "NO", then may proceed with Cephalosporin use.        Medication List     STOP taking these medications    ibuprofen 200 MG tablet Commonly known as: ADVIL       TAKE these medications    acetaminophen 500 MG tablet Commonly known as: TYLENOL Take 2 tablets (1,000 mg total) by mouth every 6 (six) hours as needed for mild pain or moderate pain (or temp > 100).   rosuvastatin 5 MG tablet Commonly known as: CRESTOR Take 5 mg by mouth daily.   traMADol 50 MG tablet Commonly known as: ULTRAM Take 1 tablet (50 mg total) by mouth every 6 (six) hours as needed for up to 5 days for moderate pain or severe pain.          Follow-up Information     Marda Stalker, PA-C. Schedule an appointment as soon as possible for a visit.   Specialty: Family Medicine Why: for hospital follow up and to recheck your renal function Contact information: [place] [address] [phone]         Jovita Kussmaul, MD. Go on 10/08/2022.   Specialty: General Surgery Why: follow up on 2/9 with arrival time of 3:10 for 3:40 appt to complete check in. Please bring photo ID and insurance card. Contact information: [address]  [place] [address] [phone]                 Signed: Caroll Rancher [facility] 10/05/2022, 1:55 PM Please see Amion for pager number during day hours 7:00am-4:30pm

## 2022-10-18 ENCOUNTER — Other Ambulatory Visit: Payer: Medicare PPO

## 2022-10-18 DIAGNOSIS — Z6829 Body mass index (BMI) 29.0-29.9, adult: Secondary | ICD-10-CM | POA: Diagnosis not present

## 2022-10-18 DIAGNOSIS — C4491 Basal cell carcinoma of skin, unspecified: Secondary | ICD-10-CM | POA: Diagnosis not present

## 2022-10-18 DIAGNOSIS — E7800 Pure hypercholesterolemia, unspecified: Secondary | ICD-10-CM | POA: Diagnosis not present

## 2022-10-18 DIAGNOSIS — Z0000 Encounter for general adult medical examination without abnormal findings: Secondary | ICD-10-CM | POA: Diagnosis not present

## 2022-10-18 DIAGNOSIS — Z23 Encounter for immunization: Secondary | ICD-10-CM | POA: Diagnosis not present

## 2022-10-18 DIAGNOSIS — K760 Fatty (change of) liver, not elsewhere classified: Secondary | ICD-10-CM | POA: Diagnosis not present

## 2022-10-28 DIAGNOSIS — H5213 Myopia, bilateral: Secondary | ICD-10-CM | POA: Diagnosis not present

## 2022-10-28 DIAGNOSIS — H2513 Age-related nuclear cataract, bilateral: Secondary | ICD-10-CM | POA: Diagnosis not present

## 2022-10-28 DIAGNOSIS — H31002 Unspecified chorioretinal scars, left eye: Secondary | ICD-10-CM | POA: Diagnosis not present

## 2023-02-16 DIAGNOSIS — L814 Other melanin hyperpigmentation: Secondary | ICD-10-CM | POA: Diagnosis not present

## 2023-02-16 DIAGNOSIS — B078 Other viral warts: Secondary | ICD-10-CM | POA: Diagnosis not present

## 2023-02-16 DIAGNOSIS — L570 Actinic keratosis: Secondary | ICD-10-CM | POA: Diagnosis not present

## 2023-02-16 DIAGNOSIS — D1801 Hemangioma of skin and subcutaneous tissue: Secondary | ICD-10-CM | POA: Diagnosis not present

## 2023-02-16 DIAGNOSIS — C44519 Basal cell carcinoma of skin of other part of trunk: Secondary | ICD-10-CM | POA: Diagnosis not present

## 2023-02-16 DIAGNOSIS — Z85828 Personal history of other malignant neoplasm of skin: Secondary | ICD-10-CM | POA: Diagnosis not present

## 2023-02-16 DIAGNOSIS — L821 Other seborrheic keratosis: Secondary | ICD-10-CM | POA: Diagnosis not present

## 2023-08-18 DIAGNOSIS — L570 Actinic keratosis: Secondary | ICD-10-CM | POA: Diagnosis not present

## 2023-08-18 DIAGNOSIS — D0461 Carcinoma in situ of skin of right upper limb, including shoulder: Secondary | ICD-10-CM | POA: Diagnosis not present

## 2023-08-18 DIAGNOSIS — L814 Other melanin hyperpigmentation: Secondary | ICD-10-CM | POA: Diagnosis not present

## 2023-08-18 DIAGNOSIS — L718 Other rosacea: Secondary | ICD-10-CM | POA: Diagnosis not present

## 2023-08-18 DIAGNOSIS — L739 Follicular disorder, unspecified: Secondary | ICD-10-CM | POA: Diagnosis not present

## 2023-08-18 DIAGNOSIS — Z85828 Personal history of other malignant neoplasm of skin: Secondary | ICD-10-CM | POA: Diagnosis not present

## 2023-08-18 DIAGNOSIS — L821 Other seborrheic keratosis: Secondary | ICD-10-CM | POA: Diagnosis not present

## 2023-08-18 DIAGNOSIS — D485 Neoplasm of uncertain behavior of skin: Secondary | ICD-10-CM | POA: Diagnosis not present

## 2023-08-18 DIAGNOSIS — L820 Inflamed seborrheic keratosis: Secondary | ICD-10-CM | POA: Diagnosis not present

## 2023-08-18 DIAGNOSIS — L711 Rhinophyma: Secondary | ICD-10-CM | POA: Diagnosis not present

## 2023-08-18 DIAGNOSIS — L853 Xerosis cutis: Secondary | ICD-10-CM | POA: Diagnosis not present

## 2023-11-17 DIAGNOSIS — Z08 Encounter for follow-up examination after completed treatment for malignant neoplasm: Secondary | ICD-10-CM | POA: Diagnosis not present

## 2023-11-17 DIAGNOSIS — E7800 Pure hypercholesterolemia, unspecified: Secondary | ICD-10-CM | POA: Diagnosis not present

## 2023-11-17 DIAGNOSIS — Z0000 Encounter for general adult medical examination without abnormal findings: Secondary | ICD-10-CM | POA: Diagnosis not present

## 2023-11-17 DIAGNOSIS — Z6829 Body mass index (BMI) 29.0-29.9, adult: Secondary | ICD-10-CM | POA: Diagnosis not present

## 2023-11-17 DIAGNOSIS — Z8546 Personal history of malignant neoplasm of prostate: Secondary | ICD-10-CM | POA: Diagnosis not present

## 2023-12-01 DIAGNOSIS — H5213 Myopia, bilateral: Secondary | ICD-10-CM | POA: Diagnosis not present

## 2023-12-01 DIAGNOSIS — H31003 Unspecified chorioretinal scars, bilateral: Secondary | ICD-10-CM | POA: Diagnosis not present

## 2023-12-22 DIAGNOSIS — S0501XA Injury of conjunctiva and corneal abrasion without foreign body, right eye, initial encounter: Secondary | ICD-10-CM | POA: Diagnosis not present

## 2023-12-29 DIAGNOSIS — R945 Abnormal results of liver function studies: Secondary | ICD-10-CM | POA: Diagnosis not present

## 2024-01-27 DIAGNOSIS — R748 Abnormal levels of other serum enzymes: Secondary | ICD-10-CM | POA: Diagnosis not present

## 2024-02-13 DIAGNOSIS — D225 Melanocytic nevi of trunk: Secondary | ICD-10-CM | POA: Diagnosis not present

## 2024-02-13 DIAGNOSIS — L814 Other melanin hyperpigmentation: Secondary | ICD-10-CM | POA: Diagnosis not present

## 2024-02-13 DIAGNOSIS — B078 Other viral warts: Secondary | ICD-10-CM | POA: Diagnosis not present

## 2024-02-13 DIAGNOSIS — Z85828 Personal history of other malignant neoplasm of skin: Secondary | ICD-10-CM | POA: Diagnosis not present

## 2024-02-13 DIAGNOSIS — C44329 Squamous cell carcinoma of skin of other parts of face: Secondary | ICD-10-CM | POA: Diagnosis not present

## 2024-02-13 DIAGNOSIS — L570 Actinic keratosis: Secondary | ICD-10-CM | POA: Diagnosis not present

## 2024-02-13 DIAGNOSIS — D0461 Carcinoma in situ of skin of right upper limb, including shoulder: Secondary | ICD-10-CM | POA: Diagnosis not present

## 2024-02-13 DIAGNOSIS — L821 Other seborrheic keratosis: Secondary | ICD-10-CM | POA: Diagnosis not present

## 2024-04-04 DIAGNOSIS — R7401 Elevation of levels of liver transaminase levels: Secondary | ICD-10-CM | POA: Diagnosis not present

## 2024-06-11 DIAGNOSIS — R748 Abnormal levels of other serum enzymes: Secondary | ICD-10-CM | POA: Diagnosis not present
